# Patient Record
Sex: Female | Born: 1950 | Race: Black or African American | Hispanic: No | Marital: Single | Smoking: Never smoker
Health system: Southern US, Community
[De-identification: ages and names within clinical notes are randomized; demographics above are authoritative.]

## PROBLEM LIST (undated history)

## (undated) DIAGNOSIS — K219 Gastro-esophageal reflux disease without esophagitis: Secondary | ICD-10-CM

## (undated) DIAGNOSIS — E785 Hyperlipidemia, unspecified: Secondary | ICD-10-CM

## (undated) DIAGNOSIS — I10 Essential (primary) hypertension: Secondary | ICD-10-CM

## (undated) DIAGNOSIS — I2510 Atherosclerotic heart disease of native coronary artery without angina pectoris: Secondary | ICD-10-CM

## (undated) DIAGNOSIS — E119 Type 2 diabetes mellitus without complications: Secondary | ICD-10-CM

## (undated) DIAGNOSIS — E559 Vitamin D deficiency, unspecified: Secondary | ICD-10-CM

## (undated) DIAGNOSIS — H9041 Sensorineural hearing loss, unilateral, right ear, with unrestricted hearing on the contralateral side: Secondary | ICD-10-CM

## (undated) DIAGNOSIS — H3320 Serous retinal detachment, unspecified eye: Secondary | ICD-10-CM

## (undated) DIAGNOSIS — R011 Cardiac murmur, unspecified: Secondary | ICD-10-CM

## (undated) HISTORY — DX: Hyperlipidemia, unspecified: E78.5

## (undated) HISTORY — PX: BREAST BIOPSY: SHX20

## (undated) HISTORY — DX: Cardiac murmur, unspecified: R01.1

## (undated) HISTORY — DX: Atherosclerotic heart disease of native coronary artery without angina pectoris: I25.10

## (undated) HISTORY — DX: Vitamin D deficiency, unspecified: E55.9

## (undated) HISTORY — DX: Type 2 diabetes mellitus without complications: E11.9

## (undated) HISTORY — DX: Essential (primary) hypertension: I10

## (undated) HISTORY — DX: Sensorineural hearing loss, unilateral, right ear, with unrestricted hearing on the contralateral side: H90.41

## (undated) HISTORY — DX: Hypercalcemia: E83.52

## (undated) HISTORY — DX: Serous retinal detachment, unspecified eye: H33.20

## (undated) HISTORY — DX: Gastro-esophageal reflux disease without esophagitis: K21.9

---

## 1990-08-24 DIAGNOSIS — I219 Acute myocardial infarction, unspecified: Secondary | ICD-10-CM

## 1990-08-24 HISTORY — DX: Acute myocardial infarction, unspecified: I21.9

## 1998-12-10 ENCOUNTER — Emergency Department (HOSPITAL_COMMUNITY): Admission: EM | Admit: 1998-12-10 | Discharge: 1998-12-11 | Payer: Self-pay | Admitting: Emergency Medicine

## 1999-01-08 ENCOUNTER — Emergency Department (HOSPITAL_COMMUNITY): Admission: EM | Admit: 1999-01-08 | Discharge: 1999-01-08 | Payer: Self-pay

## 1999-02-06 ENCOUNTER — Emergency Department (HOSPITAL_COMMUNITY): Admission: EM | Admit: 1999-02-06 | Discharge: 1999-02-06 | Payer: Self-pay

## 1999-08-11 ENCOUNTER — Emergency Department (HOSPITAL_COMMUNITY): Admission: EM | Admit: 1999-08-11 | Discharge: 1999-08-11 | Payer: Self-pay | Admitting: *Deleted

## 2000-09-22 ENCOUNTER — Emergency Department (HOSPITAL_COMMUNITY): Admission: EM | Admit: 2000-09-22 | Discharge: 2000-09-23 | Payer: Self-pay | Admitting: Emergency Medicine

## 2001-03-28 ENCOUNTER — Emergency Department (HOSPITAL_COMMUNITY): Admission: EM | Admit: 2001-03-28 | Discharge: 2001-03-29 | Payer: Self-pay | Admitting: Emergency Medicine

## 2001-07-31 ENCOUNTER — Emergency Department (HOSPITAL_COMMUNITY): Admission: EM | Admit: 2001-07-31 | Discharge: 2001-08-01 | Payer: Self-pay | Admitting: Emergency Medicine

## 2002-08-05 ENCOUNTER — Emergency Department (HOSPITAL_COMMUNITY): Admission: EM | Admit: 2002-08-05 | Discharge: 2002-08-05 | Payer: Self-pay | Admitting: Emergency Medicine

## 2002-08-05 ENCOUNTER — Encounter: Payer: Self-pay | Admitting: Emergency Medicine

## 2002-12-08 ENCOUNTER — Other Ambulatory Visit: Admission: RE | Admit: 2002-12-08 | Discharge: 2002-12-08 | Payer: Self-pay | Admitting: Obstetrics and Gynecology

## 2003-08-23 ENCOUNTER — Emergency Department (HOSPITAL_COMMUNITY): Admission: AD | Admit: 2003-08-23 | Discharge: 2003-08-23 | Payer: Self-pay | Admitting: Family Medicine

## 2005-07-18 ENCOUNTER — Inpatient Hospital Stay (HOSPITAL_COMMUNITY): Admission: EM | Admit: 2005-07-18 | Discharge: 2005-07-23 | Payer: Self-pay | Admitting: Emergency Medicine

## 2006-08-30 ENCOUNTER — Encounter: Admission: RE | Admit: 2006-08-30 | Discharge: 2006-08-30 | Payer: Self-pay | Admitting: Occupational Medicine

## 2007-02-02 ENCOUNTER — Ambulatory Visit: Payer: Self-pay | Admitting: Internal Medicine

## 2007-02-02 ENCOUNTER — Encounter (INDEPENDENT_AMBULATORY_CARE_PROVIDER_SITE_OTHER): Payer: Self-pay | Admitting: Nurse Practitioner

## 2007-02-02 LAB — CONVERTED CEMR LAB: Hgb A1c MFr Bld: >14 %

## 2007-02-03 ENCOUNTER — Encounter (INDEPENDENT_AMBULATORY_CARE_PROVIDER_SITE_OTHER): Payer: Self-pay | Admitting: Nurse Practitioner

## 2007-02-03 LAB — CONVERTED CEMR LAB
AST: 14 units/L
Albumin: 4.7 g/dL
Alkaline Phosphatase: 98 units/L
Potassium: 5.1 meq/L
Sodium: 139 meq/L
Total Bilirubin: 0.5 mg/dL
Total CHOL/HDL Ratio: 6.1
Total Protein: 8 g/dL
Triglycerides: 169 mg/dL
VLDL: 34 mg/dL

## 2007-02-08 ENCOUNTER — Ambulatory Visit: Payer: Self-pay | Admitting: *Deleted

## 2007-02-28 ENCOUNTER — Ambulatory Visit: Payer: Self-pay | Admitting: Internal Medicine

## 2007-03-09 ENCOUNTER — Ambulatory Visit: Payer: Self-pay | Admitting: Internal Medicine

## 2007-03-11 ENCOUNTER — Ambulatory Visit: Payer: Self-pay | Admitting: Internal Medicine

## 2007-06-14 ENCOUNTER — Encounter (INDEPENDENT_AMBULATORY_CARE_PROVIDER_SITE_OTHER): Payer: Self-pay | Admitting: Nurse Practitioner

## 2007-06-14 DIAGNOSIS — Z8719 Personal history of other diseases of the digestive system: Secondary | ICD-10-CM | POA: Insufficient documentation

## 2007-06-14 DIAGNOSIS — I10 Essential (primary) hypertension: Secondary | ICD-10-CM | POA: Insufficient documentation

## 2007-06-14 DIAGNOSIS — E669 Obesity, unspecified: Secondary | ICD-10-CM

## 2007-07-25 ENCOUNTER — Emergency Department (HOSPITAL_COMMUNITY): Admission: EM | Admit: 2007-07-25 | Discharge: 2007-07-25 | Payer: Self-pay | Admitting: Family Medicine

## 2007-07-26 ENCOUNTER — Telehealth (INDEPENDENT_AMBULATORY_CARE_PROVIDER_SITE_OTHER): Payer: Self-pay | Admitting: Internal Medicine

## 2007-08-02 ENCOUNTER — Ambulatory Visit: Payer: Self-pay | Admitting: Family Medicine

## 2007-08-02 DIAGNOSIS — H5789 Other specified disorders of eye and adnexa: Secondary | ICD-10-CM

## 2007-08-02 DIAGNOSIS — L259 Unspecified contact dermatitis, unspecified cause: Secondary | ICD-10-CM

## 2007-08-02 LAB — CONVERTED CEMR LAB: Hgb A1c MFr Bld: 10.6 %

## 2007-08-10 ENCOUNTER — Encounter (INDEPENDENT_AMBULATORY_CARE_PROVIDER_SITE_OTHER): Payer: Self-pay | Admitting: *Deleted

## 2007-08-17 ENCOUNTER — Telehealth (INDEPENDENT_AMBULATORY_CARE_PROVIDER_SITE_OTHER): Payer: Self-pay | Admitting: Family Medicine

## 2007-10-13 ENCOUNTER — Telehealth (INDEPENDENT_AMBULATORY_CARE_PROVIDER_SITE_OTHER): Payer: Self-pay | Admitting: Internal Medicine

## 2009-02-08 ENCOUNTER — Observation Stay: Payer: Self-pay | Admitting: Internal Medicine

## 2009-02-11 ENCOUNTER — Emergency Department (HOSPITAL_COMMUNITY): Admission: EM | Admit: 2009-02-11 | Discharge: 2009-02-11 | Payer: Self-pay | Admitting: Emergency Medicine

## 2010-04-14 ENCOUNTER — Emergency Department (HOSPITAL_COMMUNITY): Admission: EM | Admit: 2010-04-14 | Discharge: 2010-04-14 | Payer: Self-pay | Admitting: Family Medicine

## 2010-05-14 ENCOUNTER — Ambulatory Visit: Payer: Self-pay | Admitting: Physician Assistant

## 2010-05-14 DIAGNOSIS — I2510 Atherosclerotic heart disease of native coronary artery without angina pectoris: Secondary | ICD-10-CM | POA: Insufficient documentation

## 2010-05-14 LAB — CONVERTED CEMR LAB
Bilirubin Urine: NEGATIVE
Blood Glucose, Fingerstick: 205
Blood in Urine, dipstick: NEGATIVE
Glucose, Urine, Semiquant: >=1000
Hgb A1c MFr Bld: 10.3 %
Ketones, urine, test strip: NEGATIVE
Nitrite: NEGATIVE
Protein, U semiquant: 30
Specific Gravity, Urine: >=1.03
Urobilinogen, UA: 0.2
WBC Urine, dipstick: NEGATIVE
pH: 5

## 2010-05-16 ENCOUNTER — Ambulatory Visit: Payer: Self-pay | Admitting: Physician Assistant

## 2010-06-06 ENCOUNTER — Encounter: Payer: Self-pay | Admitting: Physician Assistant

## 2010-06-24 ENCOUNTER — Telehealth (INDEPENDENT_AMBULATORY_CARE_PROVIDER_SITE_OTHER): Payer: Self-pay | Admitting: Internal Medicine

## 2010-06-25 ENCOUNTER — Telehealth (INDEPENDENT_AMBULATORY_CARE_PROVIDER_SITE_OTHER): Payer: Self-pay | Admitting: Internal Medicine

## 2010-06-25 ENCOUNTER — Ambulatory Visit: Payer: Self-pay | Admitting: Physician Assistant

## 2010-07-02 ENCOUNTER — Encounter (INDEPENDENT_AMBULATORY_CARE_PROVIDER_SITE_OTHER): Payer: Self-pay | Admitting: Internal Medicine

## 2010-07-14 ENCOUNTER — Ambulatory Visit: Payer: Self-pay | Admitting: Nurse Practitioner

## 2010-07-14 DIAGNOSIS — E119 Type 2 diabetes mellitus without complications: Secondary | ICD-10-CM

## 2010-07-14 LAB — CONVERTED CEMR LAB
Bilirubin Urine: NEGATIVE
Hgb A1c MFr Bld: 8.7 %
Ketones, urine, test strip: NEGATIVE
Nitrite: NEGATIVE
Urobilinogen, UA: 0.2

## 2010-07-15 LAB — CONVERTED CEMR LAB: Microalb, Ur: 5.16 mg/dL — ABNORMAL HIGH (ref 0.00–1.89)

## 2010-07-22 ENCOUNTER — Encounter: Admission: RE | Admit: 2010-07-22 | Discharge: 2010-07-22 | Payer: Self-pay | Admitting: Internal Medicine

## 2010-07-29 ENCOUNTER — Encounter: Admission: RE | Admit: 2010-07-29 | Discharge: 2010-07-29 | Payer: Self-pay | Admitting: Internal Medicine

## 2010-08-05 HISTORY — PX: BREAST BIOPSY: SHX20

## 2010-08-20 ENCOUNTER — Encounter (INDEPENDENT_AMBULATORY_CARE_PROVIDER_SITE_OTHER): Payer: Self-pay | Admitting: Nurse Practitioner

## 2010-09-25 NOTE — Assessment & Plan Note (Signed)
Summary: Breast tenderness/Diabetes   Vital Signs:  Patient profile:   60 year old female Menstrual status:  hysterectomy Weight:      221.5 pounds BMI:     39.38 Temp:     97.8 degrees F oral Pulse rate:   88 / minute Pulse rhythm:   regular Resp:     20 per minute BP sitting:   147 / 70  (left arm) Cuff size:   regular  Vitals Entered By: Levon Hedger (July 14, 2010 9:54 AM)  Nutrition Counseling: Patient's BMI is greater than 25 and therefore counseled on weight management options. CC: right breast pain with swelling under her arm x 1 week, feels heavy and very strange, Hypertension Management Is Patient Diabetic? Yes Pain Assessment Patient in pain? yes     Location: breast CBG Result 321 CBG Device ID B  Does patient need assistance? Functional Status Self care Ambulation Normal     Menstrual Status hysterectomy   Primary Care Provider:  Tereso Newcomer, PA-C  CC:  right breast pain with swelling under her arm x 1 week, feels heavy and very strange, and Hypertension Management.  History of Present Illness:  Pt into the office with complaints of pain in breast Started 2 week ago with problems in right breast +tender +burning No previous problems with breast Admits that she used to drink lots of caffiene so she stopped drinking coffee and symptoms did not resolve Hx of fibrocystic breast   S/p hysterecomy about 25 yrs ago  Pt has to pick up her medications from the pharmacy today.  Diabetes Management History:      The patient is a 60 years old female who comes in for evaluation of DM Type 2.  She has not been enrolled in the "Diabetic Education Program".  She states understanding of dietary principles but she is not following the appropriate diet.  Self foot exams are not being performed.  She says that she is not exercising regularly.        Hypoglycemic symptoms are not occurring.  No hyperglycemic symptoms are reported.  Other comments include: pt  needs to pick up her meds from the pharmacy.    Hypertension History:      She denies headache, chest pain, and palpitations.  She notes no problems with any antihypertensive medication side effects.        Positive major cardiovascular risk factors include female age 25 years old or older, diabetes, hypertension, and family history for ischemic heart disease (males less than 51 years old).  Negative major cardiovascular risk factors include non-tobacco-user status.        Positive history for target organ damage include ASHD (either angina/prior MI/prior CABG).     Habits & Providers  Alcohol-Tobacco-Diet     Alcohol type: beer     Tobacco Status: never     Passive Smoke Exposure: no  Exercise-Depression-Behavior     Does Patient Exercise: no     Drug Use: no     Seat Belt Use: 100     Sun Exposure: occasionally  Allergies (verified): 1)  ! * Dye  Review of Systems General:  + right breast tenderness. CV:  Denies chest pain or discomfort. Resp:  Denies cough. GI:  Denies abdominal pain, nausea, and vomiting.  Physical Exam  General:  alert.   Head:  normocephalic.   Eyes:  glasses Breasts:  right breast - tender with palpation of breast tissue (especially at 6 o'clock) left breast - nontender  Lungs:  normal breath sounds.   Heart:  normal rate and regular rhythm.   Abdomen:  normal bowel sounds.   Msk:  up to the exam table Neurologic:  alert & oriented X3.   Skin:  color normal.   Psych:  Oriented X3.    Diabetes Management Exam:    Foot Exam (with socks and/or shoes not present):       Sensory-Monofilament:          Left foot: normal          Right foot: normal   Impression & Recommendations:  Problem # 1:  UNSPECIFIED BREAST SCREENING (ICD-V76.10)  ? infection in her right breast pt advised to decrease the caffiene will order mammogram  self breast exam placcard given  Orders: Mammogram (Diagnostic) (Mammo)  Problem # 2:  HYPERTENSION, BENIGN  ESSENTIAL (ICD-401.1) BP elevated but pt needs to get her medications Her updated medication list for this problem includes:    Lisinopril 5 Mg Tabs (Lisinopril) .Marland Kitchen... 1 tab by mouth daily  Problem # 3:  DIABETES MELLITUS, TYPE II (ICD-250.00)  advised pt that she needs to take insulin as ordered may need additional titration of meds Hgba1c = 8.1 today Her updated medication list for this problem includes:    Lantus Solostar 100 Unit/ml Soln (Insulin glargine) ..... Inject 60 units at bedtime    Lisinopril 5 Mg Tabs (Lisinopril) .Marland Kitchen... 1 tab by mouth daily    Novolog Flexpen 100 Unit/ml Soln (Insulin aspart) .Marland KitchenMarland KitchenMarland KitchenMarland Kitchen 30 units subcutaneously three times a day 15 minutes before meals    Aspirin 81 Mg Tabs (Aspirin) ..... Once daily  Orders: Capillary Blood Glucose/CBG (04540) UA Dipstick w/o Micro (manual) (98119) T-Urine Microalbumin w/creat. ratio [phone])  Problem # 4:  OBESITY (ICD-278.00) advise pt that she need to take her medications daily as ordered she will go pick up insulin from pharmacy  Complete Medication List: 1)  Lantus Solostar 100 Unit/ml Soln (Insulin glargine) .... Inject 60 units at bedtime 2)  Pepcid 20 Mg Tabs (Famotidine) .... Take 1 tablet by mouth two times a day as needed 3)  Lisinopril 5 Mg Tabs (Lisinopril) .Marland Kitchen.. 1 tab by mouth daily 4)  Novolog Flexpen 100 Unit/ml Soln (Insulin aspart) .... 30 units subcutaneously three times a day 15 minutes before meals 5)  Aspirin 81 Mg Tabs (Aspirin) .... Once daily 6)  Ciprofloxacin Hcl 500 Mg Tabs (Ciprofloxacin hcl) .... One tablet by mouth two times a day for infection 7)  Ibuprofen 800 Mg Tabs (Ibuprofen) .... One tablet by mouth two times a day as needed for pain  Other Orders: Hemoglobin A1C (83036) Flu Vaccine 86yrs + (57846) Admin 1st Vaccine (96295)  Diabetes Management Assessment/Plan:      Her blood pressure goal is < 130/80.    Hypertension Assessment/Plan:      The patient's hypertensive  risk group is category C: Target organ damage and/or diabetes.  Today's blood pressure is 147/70.  Her blood pressure goal is < 130/80.  Patient Instructions: 1)  Schedule a lab visit on for fasting labs - No food after midnight before this visit - lipids, cmet, cbc, tsh, rapid hiv 2)  Schedule f/u appointment for diabetes in 2 months. will need titration of medications 3)  Will need u/a, cbg, foot check. 4)  Keep your appointment for mammogram 5)  Decrease caffiene in your diet 6)  Take cipro 500mg  by mouth two times a day for 7 days. 7)  Do not take this  within 2 hours of dairy products. 8)  Also take ibuprofen 800mg  by mouth two times a day (with food) for 3 days then as needed for tenderness Prescriptions: IBUPROFEN 800 MG TABS (IBUPROFEN) One tablet by mouth two times a day as needed for pain  #50 x 0   Entered and Authorized by:   Lehman Prom FNP   Signed by:   Lehman Prom FNP on 07/14/2010   Method used:   Print then Give to Patient   RxID:   [id number] CIPROFLOXACIN HCL 500 MG TABS (CIPROFLOXACIN HCL) One tablet by mouth two times a day for infection  #14 x 0   Entered and Authorized by:   Lehman Prom FNP   Signed by:   Lehman Prom FNP on 07/14/2010   Method used:   Print then Give to Patient   RxID:   [id number]    Orders Added: 1)  Capillary Blood Glucose/CBG [82948] 2)  Est. Patient Level III [16010] 3)  Hemoglobin A1C [83036] 4)  UA Dipstick w/o Micro (manual) [81002] 5)  T-Urine Microalbumin w/creat. ratio [82043-[address]] 6)  Mammogram (Diagnostic) [Mammo] 7)  Flu Vaccine 22yrs + [90658] 8)  Admin 1st Vaccine [93235]   Immunizations Administered:  Influenza Vaccine # 1:    Vaccine Type: Fluvax 3+    Site: left deltoid    Mfr: GlaxoSmithKline    Dose: 0.5 ml    Route: IM    Given by: Levon Hedger    Exp. Date: 02/21/2011    Lot #: [id number]    VIS given: 03/18/10 version given July 14, 2010.  Flu Vaccine Consent  Questions:    Do you have a history of severe allergic reactions to this vaccine? no    Any prior history of allergic reactions to egg and/or gelatin? no    Do you have a sensitivity to the preservative Thimersol? no    Do you have a past history of Guillan-Barre Syndrome? no    Do you currently have an acute febrile illness? no    Have you ever had a severe reaction to latex? no    Vaccine information given and explained to patient? yes    Are you currently pregnant? no    ndc  [phone]  Immunizations Administered:  Influenza Vaccine # 1:    Vaccine Type: Fluvax 3+    Site: left deltoid    Mfr: GlaxoSmithKline    Dose: 0.5 ml    Route: IM    Given by: Levon Hedger    Exp. Date: 02/21/2011    Lot #: [id number]    VIS given: 03/18/10 version given July 14, 2010.  Laboratory Results   Urine Tests  Date/Time Received: July 14, 2010 10:59 AM   Routine Urinalysis   Color: lt. yellow Appearance: Clear Glucose: >=1000   (Normal Range: Negative) Bilirubin: negative   (Normal Range: Negative) Ketone: negative   (Normal Range: Negative) Spec. Gravity: 1.015   (Normal Range: 1.003-1.035) Blood: negative   (Normal Range: Negative) pH: 5.0   (Normal Range: 5.0-8.0) Protein: trace   (Normal Range: Negative) Urobilinogen: 0.2   (Normal Range: 0-1) Nitrite: negative   (Normal Range: Negative) Leukocyte Esterace: negative   (Normal Range: Negative)     Blood Tests   Date/Time Received: July 14, 2010 11:28 AM   HGBA1C: 8.7%   (Normal Range: Non-Diabetic - 3-6%   Control Diabetic - 6-8%) CBG Random:: 321       Last LDL:  250 (02/03/2007 4:03:47 PM)          Diabetic Foot Exam    10-g (5.07) Semmes-Weinstein Monofilament Test Performed by: Levon Hedger          Right Foot          Left Foot Visual Inspection               Test Control      normal         normal Site 1         normal          normal Site 2         normal         normal Site 3         normal         normal Site 4         normal         normal Site 5         normal         normal Site 6         normal         normal Site 7         normal         normal Site 8         normal         normal Site 9                    abnormal Site 10         normal         normal  Impression      normal         normal     Laboratory Results   Urine Tests    Routine Urinalysis   Color: lt. yellow Appearance: Clear Glucose: >=1000   (Normal Range: Negative) Bilirubin: negative   (Normal Range: Negative) Ketone: negative   (Normal Range: Negative) Spec. Gravity: 1.015   (Normal Range: 1.003-1.035) Blood: negative   (Normal Range: Negative) pH: 5.0   (Normal Range: 5.0-8.0) Protein: trace   (Normal Range: Negative) Urobilinogen: 0.2   (Normal Range: 0-1) Nitrite: negative   (Normal Range: Negative) Leukocyte Esterace: negative   (Normal Range: Negative)     Blood Tests     HGBA1C: 8.7%   (Normal Range: Non-Diabetic - 3-6%   Control Diabetic - 6-8%) CBG Random:: 321mg /dL

## 2010-09-25 NOTE — Letter (Signed)
Summary: Generic Letter  [facility]  [address] [place], [place] [address]   Phone: [phone]  Fax: [phone]        07/02/2010  Diamond Eckerson [address] [place], [place]  [address]  Dear Ms. SCROGGIN,  We have been unable to contact you by telephone.  Please call our office, at your earliest convenience, so that we may speak with you.   Sincerely,   Dutch Quint RN

## 2010-09-25 NOTE — Progress Notes (Signed)
Summary: Needs Hep B Records  Phone Note Outgoing Call   Summary of Call: Pt. needs records of having received Hep B vaccine.  Wants this office to call [facility]  [phone]  to get records of vaccine, then fax it to school.  She will call back with information for school fax.  States that she had vaccine about 1-2 years ago.  Called [facility] to request information.  Left on hold, no answer. Initial call taken by: Dutch Quint RN,  June 25, 2010 1:41 PM  Follow-up for Phone Call        Call placed to [facility] - spoke with Elease Hashimoto.  States will fax Hep B records to this office.  Dutch Quint RN  June 26, 2010 9:25 AM  Received fax from [facility].  There are two pages of consents, one signed giving consent and one unsigned refusing the vaccine.  No other information is given.  Left message on answering machine for pt. to return call.  Dutch Quint RN  June 26, 2010 4:34 PM   Additional Follow-up for Phone Call Additional follow up Details #1::        Did they give the vaccine--is that in the records? If not, when pt. calls back, need to inform her that she just needs to get the vaccine as it does not appear she ever received and in fact, may have refused vaccination. I would like to see the paperwork, if you would place it in my workstation chair in the clinical area (back) Additional Follow-up by: Julieanne [place] MD,  June 27, 2010 11:33 AM    Additional Follow-up for Phone Call Additional follow up Details #2::    There is nothing in the records that they gave her the vaccine.  On your chair.  Dutch Quint RN  June 27, 2010 3:37 PM  Reviewed by Dr. Delrae Alfred.  Left message on voicemail for pt. to return call.  Dutch Quint RN  June 27, 2010 3:40 PM  Left message on voicemail for pt. to return call.  Dutch Quint RN  June 30, 2010 3:06 PM  Mailbox full.  Letter sent.  Dutch Quint RN  July 02, 2010 3:06 PM

## 2010-09-25 NOTE — Letter (Signed)
Summary: FAXED REQUESTED RECORDS TO [facility]  FAXED REQUESTED RECORDS TO [facility]   Imported By: Arta Bruce 06/09/2010 12:02:24  _____________________________________________________________________  External Attachment:    Type:   Image     Comment:   External Document

## 2010-09-25 NOTE — Progress Notes (Signed)
Summary: MEDS REFILL  Phone Note Refill Request Call back at [phone]   Refills Requested: Medication #1:  NOVOLOG FLEXPEN 100 UNIT/ML SOLN per sliding scale Initial call taken by: Domenic Polite,  June 24, 2010 11:45 AM  Follow-up for Phone Call        Rx refill and note sent to [facility].  Dutch Quint RN  June 26, 2010 9:20 AM     New/Updated Medications: NOVOLOG FLEXPEN 100 UNIT/ML SOLN (INSULIN ASPART) 30 units Subcutaneously three times a day 15 minutes before meals Prescriptions: NOVOLOG FLEXPEN 100 UNIT/ML SOLN (INSULIN ASPART) 30 units Subcutaneously three times a day 15 minutes before meals  #1 month x 11   Entered by:   Armenia Shannon   Authorized by:   Julieanne [place] MD   Signed by:   Armenia Shannon on 06/25/2010   Method used:   Faxed to ...       [facility] (retail)       [address] [place], [place]  [address]       Ph: [phone]       Fax: [phone]   RxID:   [id number]

## 2010-09-25 NOTE — Letter (Signed)
Summary: EMPLOYEE HEPATITIS VACCINATION DECLINATION   EMPLOYEE HEPATITIS VACCINATION DECLINATION   Imported By: Arta Bruce 07/02/2010 15:18:37  _____________________________________________________________________  External Attachment:    Type:   Image     Comment:   External Document

## 2010-09-25 NOTE — Assessment & Plan Note (Signed)
Summary: Re-estab; Diabetes   Vital Signs:  Charlotte Stuart profile:   60 year old female Height:      63 inches Weight:      221 pounds BMI:     39.29 Temp:     98.4 degrees F oral Pulse rate:   80 / minute Pulse rhythm:   regular Resp:     18 per minute BP sitting:   151 / 80  (left arm)  Vitals Entered By: CMA Student Linzie Collin CC: office visit for DM, Charlotte Stuart is out of medication for diabetes, Charlotte Stuart wants a tetnas and hep. B shot Is Charlotte Stuart Diabetic? Yes Pain Assessment Charlotte Stuart in pain? no      CBG Result 205  Does Charlotte Stuart need assistance? Functional Status Self care Ambulation Normal   Primary Care Provider:  Tereso Newcomer, PA-C  CC:  office visit for DM, Charlotte Stuart is out of medication for diabetes, and Charlotte Stuart wants a tetnas and hep. B shot.  History of Present Illness: Here to re-estab. Was seeing Dr. Barbaraann Barthel in 2008. Went to [employer] in [place] until recently.  Was laid off and lost insurance. Ran out of insulin.  Got 70/30 and took for a while.  Out of everything now. Feels lightheaded and nauseated when her sugars are up. Was on Lantus 60 and SSI Novolog and states sugars were good. +HAs; + blurred vision + polyuria; + polydipsia; + polyphagia No chest pain or syncope. Notes increasing DOE when her sugar is up.  Usually happens when her sugars are out of control.  Nothing really out of the ordinary.  Habits & Providers  Alcohol-Tobacco-Diet     Tobacco Status: never  Exercise-Depression-Behavior     Drug Use: no  Problems Prior to Update: 1)  Preventive Health Care  (ICD-V70.0) 2)  Coronary Atherosclerosis Native Coronary Artery  (ICD-414.01) 3)  Contact Dermatitis  (ICD-692.9) 4)  Redness or Discharge of Eye  (ICD-379.93) 5)  Hypertension, Benign Essential  (ICD-401.1) 6)  Obesity  (ICD-278.00) 7)  Gastroesophageal Reflux Disease, Hx of  (ICD-V12.79) 8)  Diabetes Mellitus, Type II, Hx of  (ICD-V12.2)  Current Medications (verified): 1)  Lantus  100 Unit/ml  Soln (Insulin Glargine) .... 45 Units Subcutaneously At Night For Diabetes 2)  Protonix 40 Mg  Tbec (Pantoprazole Sodium) .Marland Kitchen.. 1 Tablet By Mouth Daily For Stomach 3)  Triamcinolone Acetonide 0.1 % Crea (Triamcinolone Acetonide) .... Apply To Rash Two Times A Day and Rub in Well 4)  Lisinopril 5 Mg  Tabs (Lisinopril) .Marland Kitchen.. 1 Tab By Mouth Daily 5)  Diabetic Shoes  Allergies (verified): 1)  ! * Dye  Past History:  Past Medical History: Current Problems:  HYPERTENSION, BENIGN ESSENTIAL (ICD-401.1) OBESITY (ICD-278.00) GASTROESOPHAGEAL REFLUX DISEASE, HX OF (ICD-V12.79) DIABETES MELLITUS, TYPE II, HX OF (ICD-V12.2) ? h/o MI   a.  1990s; [facility] in [place]; had cath; no stents; never followed up; was supposed to take ASA  Past Surgical History: Hysterectomy   a. TAH/BSO  Family History: colon CA - bro age 53 no breast CA CAD - father had MI  Social History: Never Smoked Alcohol use-yes (occ) Drug use-no Divorced 2 kids  Review of Systems      See HPI  Physical Exam  General:  alert, well-developed, and well-nourished.   Head:  normocephalic and atraumatic.   Eyes:  fundi diff to visualize Neck:  supple.   Lungs:  normal breath sounds.   Heart:  normal rate and regular rhythm.   Abdomen:  soft  and no hepatomegaly.   Extremities:  trace left pedal edema and trace right pedal edema.   Neurologic:  alert & oriented X3 and cranial nerves II-XII intact.   Psych:  normally interactive.     Impression & Recommendations:  Problem # 1:  DIABETES MELLITUS, TYPE II, HX OF (ICD-V12.2) has protein in urine get back on ACE restart meds f/u in 3 mos refer to [facility] for diet  Orders: Capillary Blood Glucose/CBG (04540) Hemoglobin A1C (98119) T-Urinalysis (14782-95621) T-Comprehensive Metabolic Panel (30865-78469) T-Lipid Profile (62952-84132)  Problem # 2:  HYPERTENSION, BENIGN ESSENTIAL (ICD-401.1) restart meds  Her updated medication list for  this problem includes:    Lisinopril 5 Mg Tabs (Lisinopril) .Marland Kitchen... 1 tab by mouth daily  Orders: T-Urinalysis (44010-27253) T-Comprehensive Metabolic Panel [phone]) T-Lipid Profile (59563-87564)  Problem # 3:  CORONARY ATHEROSCLEROSIS NATIVE CORONARY ARTERY (ICD-414.01) ? diagnosis will attempt to get records get her back on ASA for now  Her updated medication list for this problem includes:    Lisinopril 5 Mg Tabs (Lisinopril) .Marland Kitchen... 1 tab by mouth daily    Aspirin 81 Mg Tabs (Aspirin) ..... Once daily  Problem # 4:  GASTROESOPHAGEAL REFLUX DISEASE, HX OF (ICD-V12.79) can continue on pepcid has taken over the counter meds with relief  Problem # 5:  Preventive Health Care (ICD-V70.0)  needs CPP. . . will eventually get this done will need referral for colo with FHx of cancer discuss setting up CPP at f/u OV needs documentation of Hep immunity . . . had 3 shot series check labs  Orders: T-Hepatitis B Core Antibody ([id number]) T-Hepatitis B Surface Antibody (33295-18841) T-Hepatitis B Surface Antigen (66063-01601)  Complete Medication List: 1)  Lantus Solostar 100 Unit/ml Soln (Insulin glargine) .... Inject 60 units at bedtime 2)  Pepcid 20 Mg Tabs (Famotidine) .... Take 1 tablet by mouth two times a day as needed 3)  Lisinopril 5 Mg Tabs (Lisinopril) .Marland Kitchen.. 1 tab by mouth daily 4)  Novolog Flexpen 100 Unit/ml Soln (Insulin aspart) .... Per sliding scale 5)  Aspirin 81 Mg Tabs (Aspirin) .... Once daily  Charlotte Stuart Instructions: 1)  Sign release form to get records from [facility] in [phone] for heart attack.  Need discharge summary and cath report. 2)  Take an Aspirin every day.  3)  Please schedule a follow-up appointment in 3 months for diabetes. 4)  If you want to change to the [facility] office, let us know.  5)  I have faxed your medicine to the [facility]. pharmacy.  We will see if you can get them today. 6)  Place a PPD today. Prescriptions: LISINOPRIL  5 MG  TABS (LISINOPRIL) 1 tab by mouth daily  #30 x 5   Entered and Authorized by:   Tereso Newcomer PA-C   Signed by:   Tereso Newcomer PA-C on 05/14/2010   Method used:   Faxed to ...       [facility]ail)       [address] [place], [place]  [address]       Ph: [phone]       Fax: [phone]   RxID:   [id number] PEPCID 20 MG TABS (FAMOTIDINE) Take 1 tablet by mouth two times a day as needed  #60 x 3   Entered and Authorized by:   Tereso Newcomer PA-C   Signed by:   Tereso Newcomer PA-C on 05/14/2010   Method used:  Faxed to ...       [facility] (retail)       [address] [place], [place]  [address]       Ph: [phone]       Fax: [phone]   RxID:   [id number] LANTUS SOLOSTAR 100 UNIT/ML SOLN (INSULIN GLARGINE) inject 60 units at bedtime  #1 mo supply x 11   Entered and Authorized by:   Tereso Newcomer PA-C   Signed by:   Tereso Newcomer PA-C on 05/14/2010   Method used:   Faxed to ...       [facility] (retail)       [address] [place], [place]  [address]       Ph: [phone]       Fax: [phone]   RxID:   [id number] NOVOLOG FLEXPEN 100 UNIT/ML SOLN (INSULIN ASPART) per sliding scale  #1 mo supply x 11   Entered and Authorized by:   Tereso Newcomer PA-C   Signed by:   Tereso Newcomer PA-C on 05/14/2010   Method used:   Faxed to ...       [facility] (retail)       [address] [place], [place]  [address]       Ph: [phone]       Fax: [phone]   RxID:   [id number]   Laboratory Results   Urine Tests  Date/Time Received: May 14, 2010 12:10 PM   Routine Urinalysis   Glucose: >=1000   (Normal Range: Negative) Bilirubin: negative   (Normal Range: Negative) Ketone: negative   (Normal Range: Negative) Spec. Gravity: >=1.030   (Normal Range:  1.003-1.035) Blood: negative   (Normal Range: Negative) pH: 5.0   (Normal Range: 5.0-8.0) Protein: 30   (Normal Range: Negative) Urobilinogen: 0.2   (Normal Range: 0-1) Nitrite: negative   (Normal Range: Negative) Leukocyte Esterace: negative   (Normal Range: Negative)     Blood Tests   Date/Time Received: May 14, 2010 12:10 PM   HGBA1C: 10.3%   (Normal Range: Non-Diabetic - 3-6%   Control Diabetic - 6-8%) CBG Random:: 205mg /dL     Appended Document: Re-estab; Diabetes   PPD Application    Vaccine Type: PPD    Site: left forearm    Mfr: jhp    Dose: 0.1 ml    Route: ID    Given by: Armenia shannon    Exp. Date: 08/2011    Lot #: [id number]

## 2010-09-25 NOTE — Letter (Signed)
Summary: TEST ORDER FORM/MAMMOGRAM  TEST ORDER FORM/MAMMOGRAM   Imported By: Arta Bruce 07/15/2010 15:51:41  _____________________________________________________________________  External Attachment:    Type:   Image     Comment:   External Document

## 2010-11-07 LAB — DIFFERENTIAL
Basophils Relative: 0 % (ref 0–1)
Lymphocytes Relative: 36 % (ref 12–46)
Lymphs Abs: 2.6 10*3/uL (ref 0.7–4.0)
Monocytes Absolute: 0.4 10*3/uL (ref 0.1–1.0)
Monocytes Relative: 6 % (ref 3–12)
Neutro Abs: 4.2 10*3/uL (ref 1.7–7.7)
Neutrophils Relative %: 57 % (ref 43–77)

## 2010-11-07 LAB — HEMOGLOBIN A1C: Mean Plasma Glucose: 235 mg/dL — ABNORMAL HIGH (ref <117)

## 2010-11-07 LAB — POCT URINALYSIS DIPSTICK
Bilirubin Urine: NEGATIVE
Glucose, UA: 500 mg/dL — AB
Hgb urine dipstick: NEGATIVE
Ketones, ur: 40 mg/dL — AB
Nitrite: NEGATIVE

## 2010-11-07 LAB — POCT I-STAT, CHEM 8
BUN: 18 mg/dL (ref 6–23)
Chloride: 105 mEq/L (ref 96–112)
Creatinine, Ser: 0.8 mg/dL (ref 0.4–1.2)
Potassium: 4.9 mEq/L (ref 3.5–5.1)
Sodium: 136 mEq/L (ref 135–145)
TCO2: 23 mmol/L (ref 0–100)

## 2010-11-07 LAB — CBC
HCT: 39.6 % (ref 36.0–46.0)
Hemoglobin: 13.3 g/dL (ref 12.0–15.0)
RBC: 4.74 MIL/uL (ref 3.87–5.11)
WBC: 7.3 10*3/uL (ref 4.0–10.5)

## 2010-12-01 LAB — DIFFERENTIAL
Eosinophils Absolute: 0.1 10*3/uL (ref 0.0–0.7)
Eosinophils Relative: 1 % (ref 0–5)
Lymphs Abs: 2.4 10*3/uL (ref 0.7–4.0)
Monocytes Absolute: 0.4 10*3/uL (ref 0.1–1.0)
Monocytes Relative: 5 % (ref 3–12)

## 2010-12-01 LAB — URINALYSIS, ROUTINE W REFLEX MICROSCOPIC
Hgb urine dipstick: NEGATIVE
Nitrite: NEGATIVE
Specific Gravity, Urine: 1.034 — ABNORMAL HIGH (ref 1.005–1.030)
Urobilinogen, UA: 0.2 mg/dL (ref 0.0–1.0)
pH: 5 (ref 5.0–8.0)

## 2010-12-01 LAB — URINE MICROSCOPIC-ADD ON

## 2010-12-01 LAB — CBC
MCHC: 33.8 g/dL (ref 30.0–36.0)
RBC: 4.34 MIL/uL (ref 3.87–5.11)
RDW: 13.9 % (ref 11.5–15.5)

## 2010-12-01 LAB — COMPREHENSIVE METABOLIC PANEL
ALT: 22 U/L (ref 0–35)
AST: 19 U/L (ref 0–37)
Calcium: 9.5 mg/dL (ref 8.4–10.5)
GFR calc Af Amer: >60 mL/min (ref >60)
Sodium: 144 mEq/L (ref 135–145)
Total Protein: 7.3 g/dL (ref 6.0–8.3)

## 2011-01-09 NOTE — Consult Note (Signed)
NAMETENNELLE, TAFLINGER               ACCOUNT NO.:  [id number]   MEDICAL RECORD NO.:  [id number]          PATIENT TYPE:  INP   LOCATION:  5523                         FACILITY:  [facility]   PHYSICIAN:  Kristine Garbe. Ezzard Standing, M.D.DATE OF BIRTH:  [birth year]   DATE OF CONSULTATION:  07/20/2005  DATE OF DISCHARGE:                                   CONSULTATION   REASON FOR CONSULTATION:  Evaluate patient with left parotitis, left ear  complaint.   BRIEF HISTORY:  Charlotte Stuart is a 60 year old female with a history of  diabetes.  Apparently, was not taking her medication at home and had some  abdominal distress with dehydration and was admitted to the hospital.  She  was noted to have swelling of the left parotid area and complained of left  ear discomfort, as well as presently complaining of some throat discomfort  status post removal of a nasogastric tube.  Her white count on admission was  7,800.  Her most recent white count was 5,600.  Her blood sugars were  elevated at time of admission.  Presently, under reasonably good control.   PHYSICAL EXAMINATION:  HEENT:  The patient has a swelling of the left  parotid region.  On palpation, this fairly soft, but is definitely swollen  compared to the right side.  It is slightly tender.  No real induration.  Drainage from the parotid duct actually is reasonably clear.  Oral exam is  otherwise unremarkable.  Ear exam revealed a normal ear canal.  Left ear canal particularly is clear.  TM is normal.  NECK:  No significant swelling.  No significant adenopathy.   IMPRESSION:  Resolving parotitis.  Questionable whether this is bacterial  infection or not.  Would recommend treatment of this with hydration, either  p.o., or IV, along with lemon drops to promote salivation, and if she has  any fever or persistent pain, would treat with antibiotic, Keflex 500 mg  t.i.d.--5 days would probably be adequate, versus IV Ancef if she is unable  to take p.o.   Would expect this to resolve without any problems.  If she has  any persistent problems, we will follow up as an outpatient in my office or  in the hospital if needed.  Office number [phone].           ______________________________  Kristine Garbe. Ezzard Standing, M.D.     CEN/MEDQ  D:  07/20/2005  T:  07/21/2005  Job:  [id number]

## 2011-01-09 NOTE — H&P (Signed)
NAMEADRINNE, Charlotte Stuart               ACCOUNT NO.:  [id number]   MEDICAL RECORD NO.:  [id number]          PATIENT TYPE:  INP   LOCATION:  1826                         FACILITY:  [facility]   PHYSICIAN:  Melissa L. Ladona Ridgel, MD  DATE OF BIRTH:  [birth year]   DATE OF ADMISSION:  07/18/2005  DATE OF DISCHARGE:                                HISTORY & PHYSICAL   CHIEF COMPLAINT:  Chest pain.   PRIMARY CARE PHYSICIAN:  The patient's primary care physician is unassigned;  however, she has  seen Dr. Renaye Rakers.   HISTORY OF THE PRESENT ILLNESS:  The patient is a 60 year old African-  American female who has been out of work and could not get her medications.  She states she used to go to Dr. Parke Simmers, but when she could not get her  medications she quit going.  The patient presented to the emergency room  complaining of gas pain, 10/10, coming and going.  The patient states that  she took some Mylanta with some relief; however, she continues to have chest  discomfort and excessive burping.   REVIEW OF SYSTEMS:  No fevers.  No chills. No nausea.  Positive vomiting.  No joint issues and no sick contacts,  all other review of systems are  negative.   PAST MEDICAL HISTORY:  1.  Diabetes.  2.  Constipation; last bowel movement was one week ago  .   PAST SURGICAL HISTORY:  Hysterectomy.   SOCIAL HISTORY:  The patient denies tobacco use. She has an occasional drink  on the weekends.  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   FAMILY HISTORY:  Mother is living with no problems.  Father is deceased  secondary to a myocardial infarction in his 13s.   ALLERGIES:  Drug allergies none.   MEDICATIONS:  Glucophage, Glucotrol and Actos; dosages of which are unknown.  The patient has been out of her medications for quite some time.   PHYSICAL EXAMINATION:  VITAL SIGNS:  Temperature is 97.4, blood pressure  137/63, pulse 117, respirations 22, and saturation 98%.  GENERAL APPEARANCE:  This is a  mildly distress African-American female who  is  having some discomfort related to abdominal distention and belching.  HEENT:  The patient is normocephalic and atraumatic.  Pupils are equal,  round and react to light.  Extraocular movements are intact.  Mucous  membranes are moist.  NECK:  The patient has no carotid bruits and no thyromegaly.  CHEST:  The chest has decreased breath sounds bilaterally with no rhonchi,  rales or wheezes.  HEART:  Cardiovascular shows regular rate and rhythm.  Positive S1 and S2.  No S3 or S4.  no murmurs, rubs or gallops.  ABDOMEN:  The abdomen is nontender with mild-to-moderate distention and very  slow bowel sounds.  EXTREMITIES:  The extremities show no clubbing, cyanosis or edema, or  lesions.  NEUROLOGIC EXAMINATION:  Neurologically she is very histrionic.  Power is  5/5.  DTRs are 2+.  Respiratory rate is increased when talking to her and  relaxes when she speaks.   LABORATORY  DATA:  Pertinent laboratory values:  White count 7.8, hemoglobin  17.8, hematocrit 38.4 and platelets 367,000.  Sodium is 128, CO2 is 20, BUN  is 38, creatinine is 1.8, and glucose 562.  Her gap is 25.  She has no chest  x-ray;  that will be ordered.  EKG shows 96 bears/minute with no ST-T wave  changes.   ASSESSMENT AND PLAN:  This is a 60 year old African-American female with  known diabetes who presents with abdominal discomfort.  She was found to be  in diabetic ketoacidosis.  She is currently feeling betters, but has not  been able to sleep and has continued to have some sensation of needing to  belch secondary to a distended abdomen.   Currently, I agree with starting glucommander with aggressive rehydration.   1.  Cardiovascular:  We will check a repeat electrocardiogram, some cardiac      markers and chest x-ray.  We will resume simethicone.   1.  Pulmonary:  The patient has no chest pain.  We will her in  few minute      regarding an order.   1.   Gastrointestinal:  The patient is nothing per os for now.  We will      treated her with a proton pump inhibitor and simethicone.  We will check      an acute abdominal series, which should include a chest x-ray.   1.  Genitourinary:  We will check a urinalysis, and culture and sensitivity,      and perform strict Is and Os on the patient.   1.  Endocrine:  Please see the glucommander orders and order a fasting lipid      panel for the morning.   1.  Deep venous thrombosis prophylaxis:  We will check a D-dimer and if      positive we will consider doing a computerized tomography of the chest.      Melissa L. Ladona Ridgel, MD  Electronically Signed     MLT/MEDQ  D:  07/18/2005  T:  07/18/2005  Job:  [id number]

## 2011-01-09 NOTE — Discharge Summary (Signed)
NAMEAYAHNA, SOLAZZO               ACCOUNT NO.:  [id number]   MEDICAL RECORD NO.:  [id number]          PATIENT TYPE:  INP   LOCATION:  5523                         FACILITY:  [facility]   PHYSICIAN:  Sherin Quarry, MD      DATE OF BIRTH:  1950/09/18   DATE OF ADMISSION:  07/18/2005  DATE OF DISCHARGE:  07/23/2005                                 DISCHARGE SUMMARY   Charlotte Stuart is a 60 year old lady who had been out of work for about the  last year and because of this problem was unable to obtain her medications.  She had not taken any medicines consistently for her diabetes for  approximately three months. On July 18, 2005, she presented to the [facility] Emergency Room complaining of abdominal pain and nausea. She was seen  in the emergency room by Dr. Derenda Mis who noted her blood pressure was  137/63, pulse was 117, respirations 22, O2 saturation 98%. HEENT exam was  within normal limits. The chest was remarkable for diminished breath sounds  diffusely. No wheezes or rhonchi or rales. Cardiovascular exam revealed  normal S1-S2 without rubs, murmurs or gallops. The abdomen was moderately  distended. Bowel sounds were very hypoactive. There were not high-pitched or  tinkling. The abdomen was nontender to palpation. Neurologic testing and  examination of the extremities was normal.   The initial laboratory studies were remarkable for A1c hemoglobin 11.9,  creatinine 1.1, BUN of 36. The glucose was 583, CO2 was 20. White count was  7800, hemoglobin was 12.8. X-ray studies showed dilated loops of small bowel  consistent with ileus or obstruction. Thus, the patient was admitted for  treatment of uncontrolled diabetes and abdominal ileus.   On admission, Dr. Ladona Ridgel initiated Glucomander protocol giving the patient  10 units of Regular insulin as a bolus and then starting a moderately  insulin sensitive protocol. The patient was given normal saline at 250  cc/hour. She was given  Zofran 4 milligrams every 6 hours p.r.n. for nausea  and Protonix 40 milligrams IV daily. She was also given Reglan 10 milligrams  IV every 6 hours. When the blood sugar came down into the desirable range,  the patient was placed on a sliding scale insulin regimen supplemented by  Lantus insulin. Subsequently, the patient experienced persistent vomiting  and after review of x-ray reports decision was made to place a nasogastric  tube. This gave her relief from her nausea and she was documented with  hypokalemia and therefore, potassium replacement was given by the  intravenous route.   By July 19, 2005, the patient was feeling much better. Her nasogastric  tube was clamped at that time.   By July 20, 2005, she was passing flatus and her nasogastric tube was  discontinued. She developed transient swelling of her left side of her face  and area around the left ear which was felt to be due to parotitis. This  responded to intravenous hydration and resolved quite quickly.   By July 21, 2005, the patient was doing well and therefore she was  advanced to a bland  soft carbohydrate-modified diet. As her x-ray showed  evidence of substantial stool in the colon, she was started on lactulose and  as a consequence had several apparently normal bowel movements. We discussed  the management of her diabetes. Previously, she had attempted to manage her  diabetes with the use of oral agents which she had difficulty taking  consistently. She felt very strongly that she did not want to take pills for  her diabetes. Instead, she wished to have her diabetes regulated with the  use of insulin. I strongly suggested to her that the best approach would be  to combine insulin therapy with oral hypoglycemic agents but she did not  wish to do this. Therefore I recommended that she be placed on a twice daily  insulin regimen. I emphasized to her that it was critical in patient who  take insulin that she  monitor her blood and see a physician on a regular  basis to adjust her dosage.   By July 23, 2005, it was felt reasonable to discharge the patient.   DISCHARGE DIAGNOSES:  1.  Small bowel ileus/constipation, resolved.  2.  Uncontrolled diabetes secondary to medication noncompliance.  3.  Hypokalemia, resolved noted by July 22, 2005 potassium level up to      3.9.  4.  Parotitis, resolved.   DISCHARGE MEDICATIONS:  The patient will continue Protonix 40 milligrams  p.o. daily, K-Dur 20 mEq, Chronulac 1-2 tablespoons daily with a stool  softener. In regards to her insulin therapy, I switched her to 70/30 insulin  at I felt this would be both economical and reasonably convenient. In the  hospital, she had been receiving a total of approximately 30 units of  insulin a day in the form of Lantus insulin and Humalog insulin administered  via sliding scale. Therefore, I suggested that she take 15 units of 70/30  insulin before breakfast and 15 units 70/30 insulin before supper. Again as  I previously stated, I emphasized to her that no matter what dose of insulin  she was sent home on, a doctor would need to see her regularly and adjust  this medication after her discharge. Condition at the time of discharge was  fair.           ______________________________  Sherin Quarry, MD     SY/MEDQ  D:  07/22/2005  T:  07/23/2005  Job:  [id number]   cc:   Renaye Rakers, M.D.  Fax: [phone]

## 2012-10-31 ENCOUNTER — Other Ambulatory Visit: Payer: Self-pay

## 2013-08-01 ENCOUNTER — Institutional Professional Consult (permissible substitution): Payer: Self-pay | Admitting: Pulmonary Disease

## 2013-09-11 ENCOUNTER — Institutional Professional Consult (permissible substitution): Payer: Self-pay | Admitting: Pulmonary Disease

## 2013-09-14 ENCOUNTER — Encounter: Payer: Self-pay | Admitting: Pulmonary Disease

## 2013-12-27 DIAGNOSIS — R609 Edema, unspecified: Secondary | ICD-10-CM | POA: Insufficient documentation

## 2013-12-27 DIAGNOSIS — E785 Hyperlipidemia, unspecified: Secondary | ICD-10-CM | POA: Insufficient documentation

## 2013-12-27 DIAGNOSIS — K219 Gastro-esophageal reflux disease without esophagitis: Secondary | ICD-10-CM | POA: Insufficient documentation

## 2013-12-27 DIAGNOSIS — J309 Allergic rhinitis, unspecified: Secondary | ICD-10-CM | POA: Insufficient documentation

## 2013-12-27 DIAGNOSIS — E119 Type 2 diabetes mellitus without complications: Secondary | ICD-10-CM | POA: Insufficient documentation

## 2013-12-27 DIAGNOSIS — K5900 Constipation, unspecified: Secondary | ICD-10-CM | POA: Insufficient documentation

## 2013-12-27 DIAGNOSIS — Z794 Long term (current) use of insulin: Secondary | ICD-10-CM

## 2014-07-18 ENCOUNTER — Other Ambulatory Visit: Payer: Self-pay | Admitting: Physician Assistant

## 2014-07-18 DIAGNOSIS — N63 Unspecified lump in unspecified breast: Secondary | ICD-10-CM

## 2014-07-18 DIAGNOSIS — N644 Mastodynia: Secondary | ICD-10-CM

## 2014-08-06 ENCOUNTER — Other Ambulatory Visit: Payer: Self-pay

## 2014-08-09 ENCOUNTER — Ambulatory Visit
Admission: RE | Admit: 2014-08-09 | Discharge: 2014-08-09 | Disposition: A | Discharge status: Home / Self Care | Payer: Medicare Other | Source: Ambulatory Visit | Attending: Physician Assistant | Admitting: Physician Assistant

## 2014-08-09 ENCOUNTER — Encounter (INDEPENDENT_AMBULATORY_CARE_PROVIDER_SITE_OTHER): Payer: Self-pay

## 2014-08-09 DIAGNOSIS — N63 Unspecified lump in unspecified breast: Secondary | ICD-10-CM

## 2014-08-09 DIAGNOSIS — N644 Mastodynia: Secondary | ICD-10-CM

## 2014-09-12 ENCOUNTER — Other Ambulatory Visit: Payer: Self-pay | Admitting: Physician Assistant

## 2014-09-12 DIAGNOSIS — N644 Mastodynia: Secondary | ICD-10-CM

## 2014-09-12 DIAGNOSIS — N63 Unspecified lump in unspecified breast: Secondary | ICD-10-CM

## 2015-06-26 DIAGNOSIS — H3342 Traction detachment of retina, left eye: Secondary | ICD-10-CM | POA: Insufficient documentation

## 2015-09-16 ENCOUNTER — Other Ambulatory Visit: Payer: Self-pay

## 2015-09-16 DIAGNOSIS — Z1231 Encounter for screening mammogram for malignant neoplasm of breast: Secondary | ICD-10-CM

## 2015-09-22 DIAGNOSIS — E785 Hyperlipidemia, unspecified: Secondary | ICD-10-CM | POA: Insufficient documentation

## 2015-09-22 DIAGNOSIS — E559 Vitamin D deficiency, unspecified: Secondary | ICD-10-CM | POA: Insufficient documentation

## 2015-09-22 DIAGNOSIS — Z794 Long term (current) use of insulin: Secondary | ICD-10-CM | POA: Insufficient documentation

## 2015-09-22 DIAGNOSIS — M542 Cervicalgia: Secondary | ICD-10-CM | POA: Insufficient documentation

## 2015-10-03 ENCOUNTER — Ambulatory Visit
Admission: RE | Admit: 2015-10-03 | Discharge: 2015-10-03 | Disposition: A | Discharge status: Home / Self Care | Payer: Medicare HMO | Source: Ambulatory Visit

## 2015-10-03 DIAGNOSIS — Z1231 Encounter for screening mammogram for malignant neoplasm of breast: Secondary | ICD-10-CM

## 2015-10-22 DIAGNOSIS — R011 Cardiac murmur, unspecified: Secondary | ICD-10-CM | POA: Insufficient documentation

## 2016-01-13 ENCOUNTER — Encounter: Payer: Medicare HMO | Admitting: Sports Medicine

## 2016-01-21 NOTE — Progress Notes (Signed)
This encounter was created in error - please disregard.

## 2016-02-17 ENCOUNTER — Ambulatory Visit: Payer: Medicare HMO | Admitting: Sports Medicine

## 2016-08-10 ENCOUNTER — Encounter: Payer: Self-pay | Admitting: Podiatry

## 2016-08-10 ENCOUNTER — Ambulatory Visit (INDEPENDENT_AMBULATORY_CARE_PROVIDER_SITE_OTHER): Payer: Medicare HMO | Admitting: Podiatry

## 2016-08-10 DIAGNOSIS — L603 Nail dystrophy: Secondary | ICD-10-CM

## 2016-08-10 DIAGNOSIS — M79609 Pain in unspecified limb: Secondary | ICD-10-CM | POA: Diagnosis not present

## 2016-08-10 DIAGNOSIS — L608 Other nail disorders: Secondary | ICD-10-CM | POA: Diagnosis not present

## 2016-08-10 DIAGNOSIS — Z79899 Other long term (current) drug therapy: Secondary | ICD-10-CM

## 2016-08-10 DIAGNOSIS — B351 Tinea unguium: Secondary | ICD-10-CM | POA: Diagnosis not present

## 2016-08-10 NOTE — Progress Notes (Signed)
   Subjective: Patient presents today for possible treatment and evaluation of fungal nails bilaterally 1 through 5. Patient states that the nails have been discolored and thickened for greater than 1 month. Patient presents today for further treatment and evaluation.  Objective: Physical Exam General: The patient is alert and oriented x3 in no acute distress.  Dermatology: Hyperkeratotic, discolored, thickened, onychodystrophy of nails noted bilaterally.  Skin is warm, dry and supple bilateral lower extremities. Negative for open lesions or macerations.  Vascular: Palpable pedal pulses bilaterally. No edema or erythema noted. Capillary refill within normal limits.  Neurological: Epicritic and protective threshold grossly intact bilaterally.   Musculoskeletal Exam: Range of motion within normal limits to all pedal and ankle joints bilateral. Muscle strength 5/5 in all groups bilateral.   Assessment: #1 onychodystrophy bilateral toenails #2 possible onychomycosis #3 hyperkeratotic nails bilateral  Plan of Care:  #1 Patient was evaluated. #2 Orders for liver function tests were ordered today.  #3 Today nail biopsy was taken and sent to pathology for fungal culture. #5 patient is to return to clinic in 4 weeks to discuss fungal culture nail biopsy findings and LFT results and discuss different treatment options.   Brent M. Evans, DPM [facility]  Dr. Brent M. Evans, DPM    [address]                                        [place], [place] [address]                Office [phone]  Fax [phone]      

## 2016-08-31 ENCOUNTER — Ambulatory Visit: Payer: Medicare HMO | Admitting: Podiatry

## 2016-12-22 DIAGNOSIS — H93A1 Pulsatile tinnitus, right ear: Secondary | ICD-10-CM | POA: Insufficient documentation

## 2016-12-25 DIAGNOSIS — H9203 Otalgia, bilateral: Secondary | ICD-10-CM | POA: Insufficient documentation

## 2016-12-25 DIAGNOSIS — H9041 Sensorineural hearing loss, unilateral, right ear, with unrestricted hearing on the contralateral side: Secondary | ICD-10-CM | POA: Insufficient documentation

## 2017-02-09 ENCOUNTER — Other Ambulatory Visit: Payer: Self-pay | Admitting: Internal Medicine

## 2017-02-09 DIAGNOSIS — Z1231 Encounter for screening mammogram for malignant neoplasm of breast: Secondary | ICD-10-CM

## 2017-02-15 ENCOUNTER — Other Ambulatory Visit: Payer: Self-pay | Admitting: Internal Medicine

## 2017-02-15 DIAGNOSIS — N63 Unspecified lump in unspecified breast: Secondary | ICD-10-CM

## 2017-02-17 ENCOUNTER — Ambulatory Visit: Payer: Medicare HMO | Admitting: Podiatry

## 2017-02-18 ENCOUNTER — Ambulatory Visit
Admission: RE | Admit: 2017-02-18 | Discharge: 2017-02-18 | Disposition: A | Discharge status: Home / Self Care | Payer: Medicare HMO | Source: Ambulatory Visit | Attending: Internal Medicine | Admitting: Internal Medicine

## 2017-02-18 DIAGNOSIS — N63 Unspecified lump in unspecified breast: Secondary | ICD-10-CM

## 2017-04-08 DIAGNOSIS — R0609 Other forms of dyspnea: Secondary | ICD-10-CM

## 2017-04-09 DIAGNOSIS — H3321 Serous retinal detachment, right eye: Secondary | ICD-10-CM | POA: Insufficient documentation

## 2017-04-12 ENCOUNTER — Ambulatory Visit: Payer: Medicare HMO | Admitting: Podiatry

## 2017-04-19 ENCOUNTER — Ambulatory Visit (INDEPENDENT_AMBULATORY_CARE_PROVIDER_SITE_OTHER): Payer: Medicare HMO | Admitting: Podiatry

## 2017-04-19 DIAGNOSIS — M79676 Pain in unspecified toe(s): Secondary | ICD-10-CM | POA: Diagnosis not present

## 2017-04-19 DIAGNOSIS — B351 Tinea unguium: Secondary | ICD-10-CM | POA: Diagnosis not present

## 2017-04-19 DIAGNOSIS — R002 Palpitations: Secondary | ICD-10-CM | POA: Insufficient documentation

## 2017-04-19 MED ORDER — NONFORMULARY OR COMPOUNDED ITEM: 0 refills | Status: DC

## 2017-04-19 NOTE — Addendum Note (Signed)
Addended by: Marcell Anger on: 04/19/2017 01:52 PM   Modules accepted: Orders

## 2017-04-19 NOTE — Progress Notes (Signed)
   SUBJECTIVE Patient  presents to office today complaining of elongated, thickened nails. Pain while ambulating in shoes. Patient is unable to trim their own nails.   OBJECTIVE General Patient is awake, alert, and oriented x 3 and in no acute distress. Derm Skin is dry and supple bilateral. Negative open lesions or macerations. Remaining integument unremarkable. Nails are tender, long, thickened and dystrophic with subungual debris, consistent with onychomycosis, 1-5 bilateral. No signs of infection noted. Vasc  DP and PT pedal pulses palpable bilaterally. Temperature gradient within normal limits.  Neuro Epicritic and protective threshold sensation diminished bilaterally.  Musculoskeletal Exam No symptomatic pedal deformities noted bilateral. Muscular strength within normal limits.  ASSESSMENT 1. Onychodystrophic nails 1-5 bilateral with hyperkeratosis of nails.  2. Onychomycosis of nail due to dermatophyte bilateral 3. Pain in foot bilateral  PLAN OF CARE 1. Patient evaluated today.  2. Instructed to maintain good pedal hygiene and foot care.  3. Mechanical debridement of nails 1-5 bilaterally performed using a nail nipper. Filed with dremel without incident.  4. The patient would like to pursue topical antifungal medication. Prescription for antifungal nail lacquer through [facility]. 5. Return to clinic in 6 mos.    Felecia Shelling, DPM [facility]  Dr. Felecia Shelling, DPM    [address]                                        [place], [place] [address]                Office [phone]  Fax [phone]

## 2017-07-19 ENCOUNTER — Ambulatory Visit: Payer: Medicare HMO | Admitting: Podiatry

## 2017-08-10 DIAGNOSIS — R400 Somnolence: Secondary | ICD-10-CM | POA: Insufficient documentation

## 2017-10-18 ENCOUNTER — Ambulatory Visit: Payer: Medicare HMO | Admitting: Podiatry

## 2017-10-27 ENCOUNTER — Ambulatory Visit (INDEPENDENT_AMBULATORY_CARE_PROVIDER_SITE_OTHER): Payer: Medicare HMO | Admitting: Podiatry

## 2017-10-27 ENCOUNTER — Encounter: Payer: Self-pay | Admitting: Podiatry

## 2017-10-27 DIAGNOSIS — M79676 Pain in unspecified toe(s): Secondary | ICD-10-CM | POA: Diagnosis not present

## 2017-10-27 DIAGNOSIS — B351 Tinea unguium: Secondary | ICD-10-CM

## 2017-10-27 DIAGNOSIS — E0843 Diabetes mellitus due to underlying condition with diabetic autonomic (poly)neuropathy: Secondary | ICD-10-CM | POA: Diagnosis not present

## 2017-10-27 MED ORDER — NONFORMULARY OR COMPOUNDED ITEM: 2 refills | Status: DC

## 2017-10-31 NOTE — Progress Notes (Signed)
   SUBJECTIVE 67 year old female with a history of diabetes mellitus presents to office today complaining of elongated, thickened nails that cause pain while ambulating in shoes. She is unable to trim her own nails. She is also requesting a refill on the antifungal nail lacquer from [facility]. Patient is here for further evaluation and treatment.   No past medical history on file.  OBJECTIVE General Patient is awake, alert, and oriented x 3 and in no acute distress. Derm Skin is dry and supple bilateral. Negative open lesions or macerations. Remaining integument unremarkable. Nails are tender, long, thickened and dystrophic with subungual debris, consistent with onychomycosis, 1-5 bilateral. No signs of infection noted. Vasc  DP and PT pedal pulses palpable bilaterally. Temperature gradient within normal limits.  Neuro Epicritic and protective threshold sensation diminished bilaterally.  Musculoskeletal Exam No symptomatic pedal deformities noted bilateral. Muscular strength within normal limits.  ASSESSMENT 1. Diabetes Mellitus w/ peripheral neuropathy 2. Onychomycosis of nail due to dermatophyte bilateral 3. Pain in foot bilateral  PLAN OF CARE 1. Patient evaluated today. 2. Instructed to maintain good pedal hygiene and foot care. Stressed importance of controlling blood sugar.  3. Mechanical debridement of nails 1-5 bilaterally performed using a nail nipper. Filed with dremel without incident.  4. Refill prescription for antifungal nail lacquer to be dispensed from [facility].  5. Return to clinic in 3 mos.     Felecia ShellingBrent M. Letonia Stead, DPM [facility]  Dr. Felecia ShellingBrent M. Tuyen Uncapher, DPM    [address]                                        [place], [place] [id number]                Office [phone]  Fax [phone]

## 2017-12-20 ENCOUNTER — Institutional Professional Consult (permissible substitution): Payer: Medicare HMO | Admitting: Neurology

## 2017-12-20 ENCOUNTER — Telehealth: Payer: Self-pay

## 2017-12-20 NOTE — Telephone Encounter (Signed)
Pt did not show for their appt with Dr. Athar today.  

## 2017-12-21 ENCOUNTER — Encounter: Payer: Self-pay | Admitting: Neurology

## 2017-12-23 IMAGING — MG MM DIGITAL SCREENING BILAT W/ TOMO W/ CAD
6 of 10 series · 6 of 26 positions shown · non-contrast
Comparison: Previous exam(s).

CLINICAL DATA: Screening.

EXAM:
DIGITAL SCREENING BILATERAL MAMMOGRAM WITH 3D TOMO WITH CAD

[L MLO (1 of 2)]
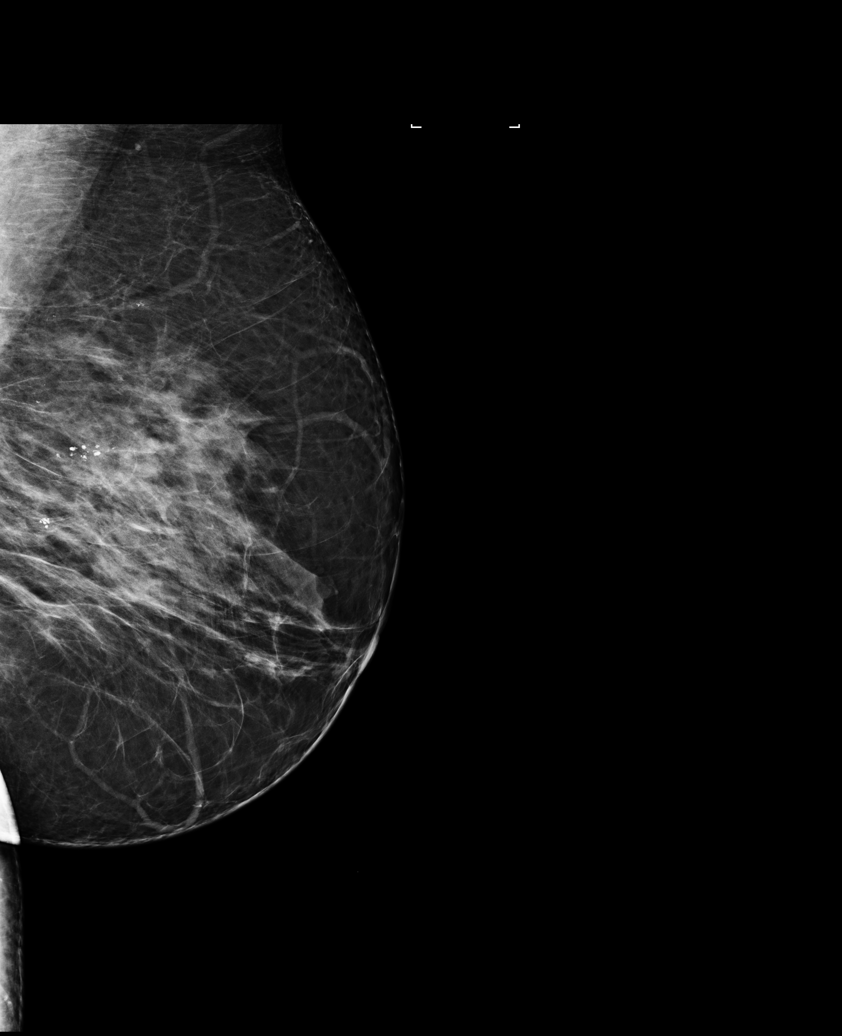

[R MLO (1 of 2)]
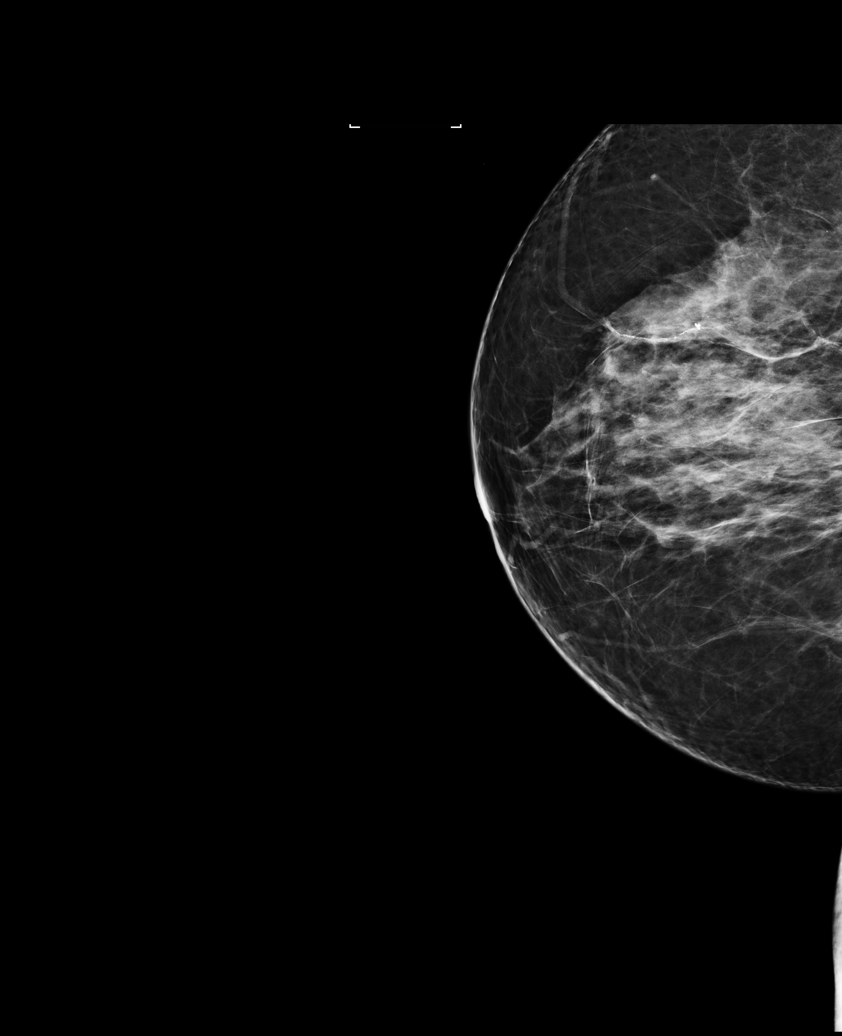

[R CC]
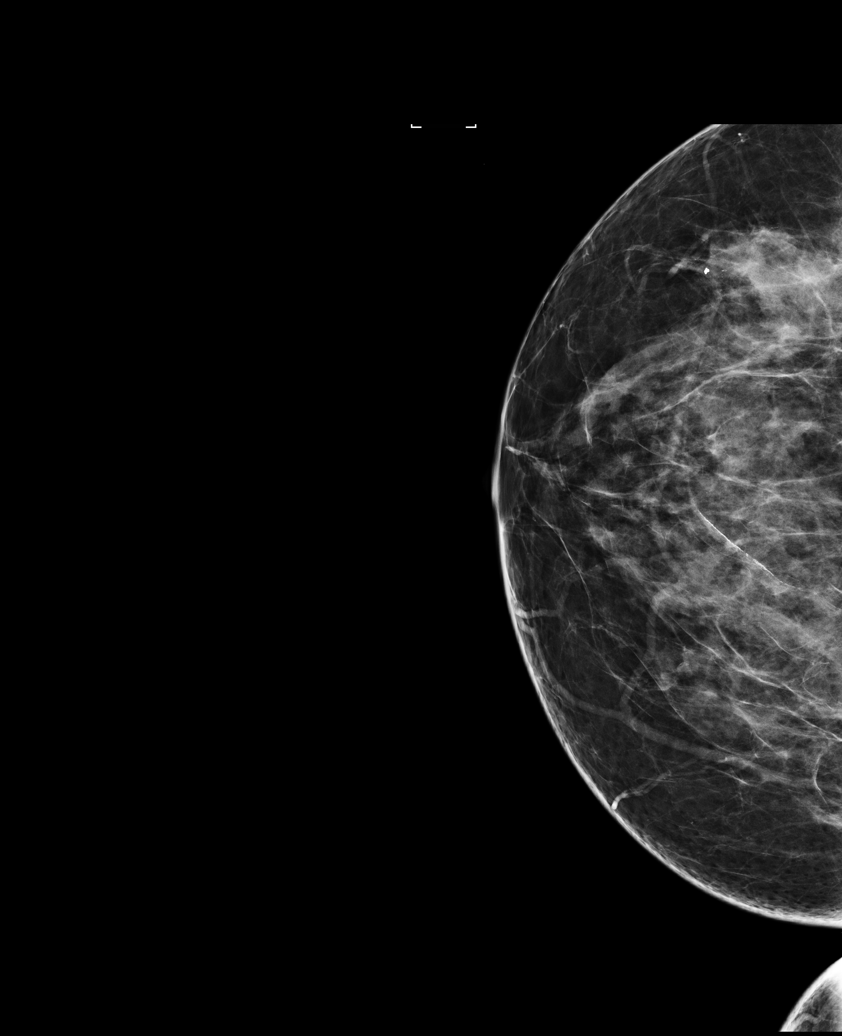

[L CC]
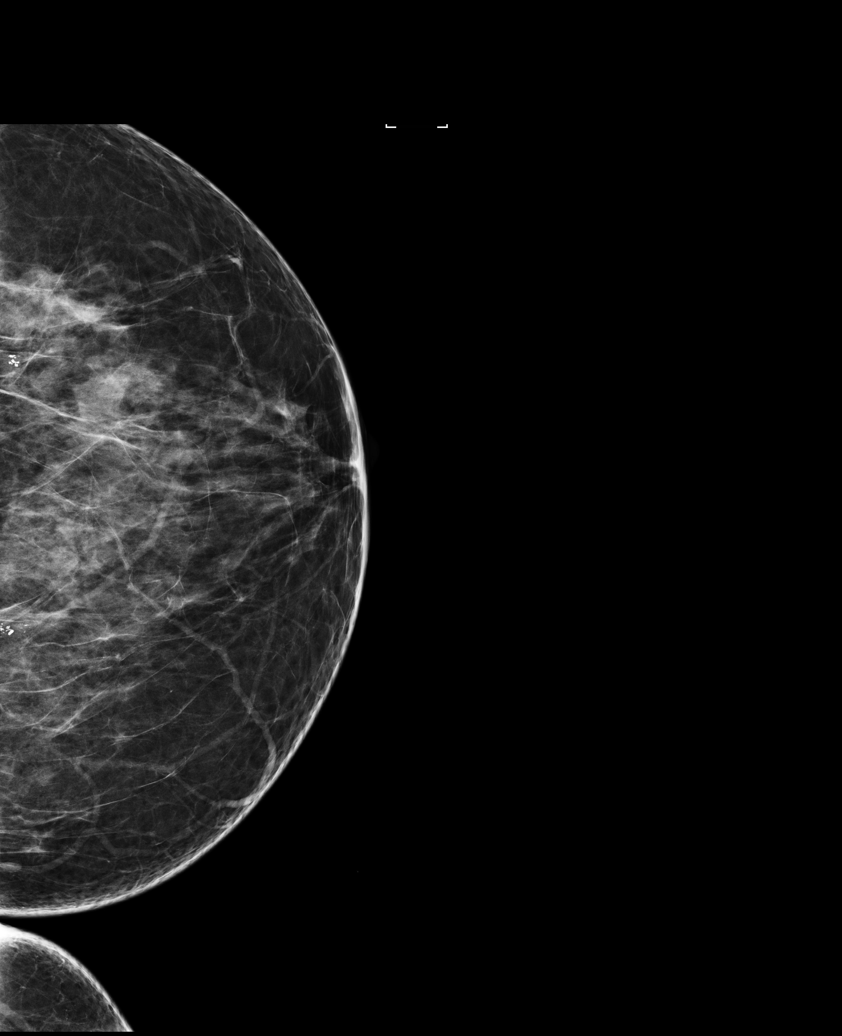

[L MLO (2 of 2)]
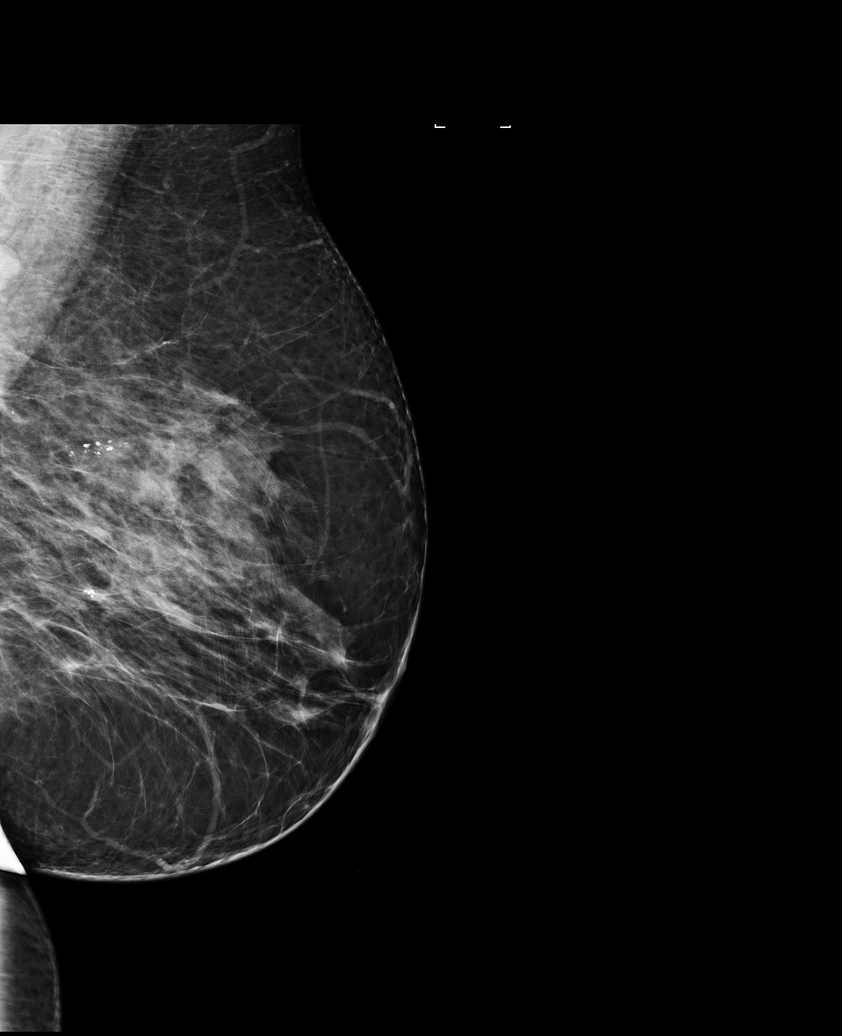

[R MLO (2 of 2)]
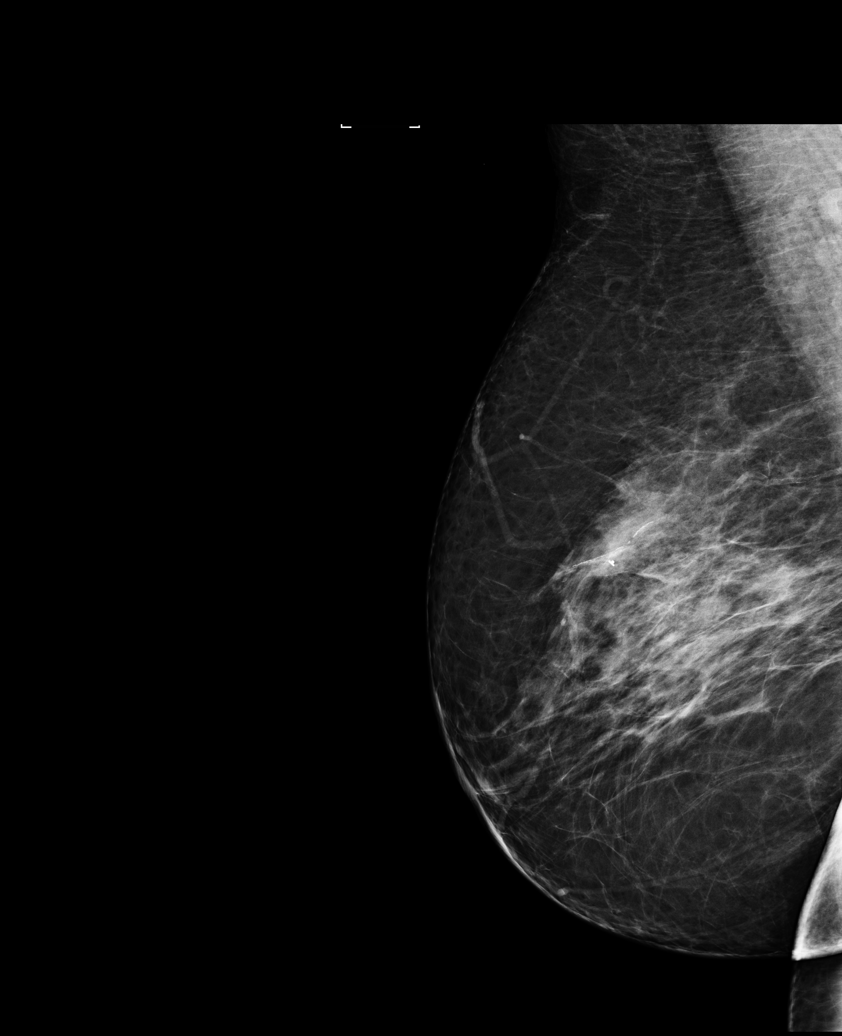

[6 of 26 positions shown; findings below may reference images not displayed]

ACR Breast Density Category c: The breast tissue is heterogeneously
dense, which may obscure small masses.
FINDINGS: There are no findings suspicious for malignancy. Images were
processed with CAD.
IMPRESSION: No mammographic evidence of malignancy. A result letter of this
screening mammogram will be mailed directly to the patient.

RECOMMENDATION:
Screening mammogram in one year. (Code:OA-G-1SS)

BI-RADS CATEGORY  1: Negative.

## 2018-01-04 ENCOUNTER — Other Ambulatory Visit: Payer: Self-pay | Admitting: Ophthalmology

## 2018-01-26 ENCOUNTER — Ambulatory Visit: Payer: Medicare HMO | Admitting: Podiatry

## 2018-02-07 ENCOUNTER — Encounter: Payer: Medicare HMO | Admitting: Podiatry

## 2018-02-14 NOTE — Progress Notes (Signed)
This encounter was created in error - please disregard.

## 2018-04-18 ENCOUNTER — Other Ambulatory Visit: Payer: Self-pay | Admitting: Internal Medicine

## 2018-04-18 DIAGNOSIS — Z1239 Encounter for other screening for malignant neoplasm of breast: Secondary | ICD-10-CM

## 2018-05-23 ENCOUNTER — Encounter: Payer: Medicare HMO | Admitting: Podiatry

## 2018-05-30 NOTE — Progress Notes (Signed)
This encounter was created in error - please disregard.

## 2018-06-14 ENCOUNTER — Ambulatory Visit: Payer: Medicare HMO

## 2018-08-01 ENCOUNTER — Ambulatory Visit (INDEPENDENT_AMBULATORY_CARE_PROVIDER_SITE_OTHER): Payer: Medicare HMO | Admitting: Podiatry

## 2018-08-01 DIAGNOSIS — E1142 Type 2 diabetes mellitus with diabetic polyneuropathy: Secondary | ICD-10-CM | POA: Diagnosis not present

## 2018-08-01 DIAGNOSIS — M79676 Pain in unspecified toe(s): Secondary | ICD-10-CM

## 2018-08-01 DIAGNOSIS — B351 Tinea unguium: Secondary | ICD-10-CM

## 2018-08-01 NOTE — Patient Instructions (Signed)

## 2018-09-03 ENCOUNTER — Encounter: Payer: Self-pay | Admitting: Podiatry

## 2018-09-03 NOTE — Progress Notes (Signed)
Subjective: Charlotte Stuart presents today with history of neuropathy with cc of painful, mycotic toenails.  Pain is aggravated when wearing enclosed shoe gear and relieved with periodic professional debridement.   Current Outpatient Medications:  .  ACCU-CHEK SMARTVIEW test strip, USE TO TEST BLOOD SUGAR TID, Disp: , Rfl: 0 .  albuterol (PROAIR HFA) 108 (90 Base) MCG/ACT inhaler, Inhale into the lungs., Disp: , Rfl:  .  amoxicillin-clavulanate (AUGMENTIN) 875-125 MG tablet, TK 1 T PO BID FOR 10 DAYS, Disp: , Rfl: 0 .  aspirin EC 81 MG tablet, Take 81 mg by mouth daily., Disp: , Rfl:  .  atorvastatin (LIPITOR) 20 MG tablet, , Disp: , Rfl:  .  atorvastatin (LIPITOR) 20 MG tablet, TK 1 T PO HS, Disp: , Rfl: 3 .  atropine 1 % ophthalmic solution, , Disp: , Rfl:  .  BD PEN NEEDLE NANO U/F 32G X 4 MM MISC, U UTD WITH VICTOZA, Disp: , Rfl: 5 .  brimonidine (ALPHAGAN) 0.2 % ophthalmic solution, , Disp: , Rfl:  .  bumetanide (BUMEX) 2 MG tablet, , Disp: , Rfl:  .  Cholecalciferol (VITAMIN D) 50 MCG (2000 UT) tablet, Take by mouth., Disp: , Rfl:  .  ciprofloxacin (CILOXAN) 0.3 % ophthalmic solution, , Disp: , Rfl:  .  Continuous Blood Gluc Receiver (FREESTYLE LIBRE READER) DEVI, Scan as needed up to 3 times per day as directed. Dx E11.65, Disp: , Rfl:  .  Continuous Blood Gluc Sensor (FREESTYLE LIBRE 14 DAY SENSOR) MISC, FreeStyle Libre 14 Day Sensor kit, Disp: , Rfl:  .  Continuous Blood Gluc Sensor (FREESTYLE LIBRE SENSOR SYSTEM) MISC, Inject into the skin., Disp: , Rfl:  .  diazepam (VALIUM) 5 MG tablet, diazepam 5 mg tablet, Disp: , Rfl:  .  Difluprednate (DUREZOL) 0.05 % EMUL, INT 1 GTT INTO THE LEFT EYE BID PRF DISCOMFORT, Disp: , Rfl:  .  FLUVIRIN SUSP, ADM 0.5ML IM UTD, Disp: , Rfl: 0 .  furosemide (LASIX) 40 MG tablet, TK 1 T PO  TWICE A WEEK PRN FOR FLUID RETENTION, Disp: , Rfl: 2 .  glipiZIDE (GLUCOTROL XL) 5 MG 24 hr tablet, Take 5 mg by mouth daily with breakfast., Disp: , Rfl:  .   glucose blood test strip, Accu-Chek SmartView Test Strips, Disp: , Rfl:  .  glucose blood test strip, USE TO TEST BLOOD SUGAR THREE TIMES DAILY, Disp: , Rfl:  .  glucose blood test strip, USE TO TEST BLOOD SUGAR THREE TIMES DAILY, Disp: , Rfl:  .  insulin regular human CONCENTRATED (HUMULIN R) 500 UNIT/ML injection, , Disp: , Rfl:  .  Insulin Syringe-Needle U-100 (INSULIN SYRINGE .5CC/31GX5/16") 31G X 5/16" 0.5 ML MISC, , Disp: , Rfl:  .  Insulin Syringe-Needle U-100 (INSULIN SYRINGE 1CC/31GX5/16") 31G X 5/16" 1 ML MISC, , Disp: , Rfl:  .  linaclotide (LINZESS) 145 MCG CAPS capsule, , Disp: , Rfl:  .  linaclotide (LINZESS) 145 MCG CAPS capsule, Take by mouth., Disp: , Rfl:  .  lisinopril-hydrochlorothiazide (PRINZIDE,ZESTORETIC) 10-12.5 MG tablet, , Disp: , Rfl:  .  LORazepam (ATIVAN) 1 MG tablet, lorazepam 1 mg tablet, Disp: , Rfl:  .  metFORMIN (GLUCOPHAGE-XR) 500 MG 24 hr tablet, Take 500 mg by mouth daily with breakfast., Disp: , Rfl:  .  methylPREDNISolone (MEDROL DOSEPAK) 4 MG TBPK tablet, , Disp: , Rfl:  .  metoCLOPramide (REGLAN) 5 MG tablet, Take 5 mg by mouth., Disp: , Rfl:  .  metoprolol succinate (TOPROL XL)  25 MG 24 hr tablet, Take 25 mg by mouth., Disp: , Rfl:  .  neomycin-polymyxin b-dexamethasone (MAXITROL) 3.5-10000-0.1 OINT, APPLY A SMALL AMOUNT ONTO SUTURES OR OPERATIVE SITE IN AFFECTED EYE BID FOR 3 DAYS THEN STOP, Disp: , Rfl: 0 .  NONFORMULARY OR COMPOUNDED ITEM, [facility]  Onychomycosis Nail Lacquer -  Fluconazole 2%, Terbinafine 1% DMSO Apply to affected nail once daily Qty. 120 gm 3 refills, Disp: 1 each, Rfl: 0 .  NONFORMULARY OR COMPOUNDED ITEM, [facility]  Onychomycosis Nail Lacquer -  Fluconazole 2%, Terbinafine 1% DMSO Apply to affected nail once daily Qty. 120 gm 3 refills, Disp: 1 each, Rfl: 2 .  omeprazole (PRILOSEC) 20 MG capsule, , Disp: , Rfl:  .  omeprazole (PRILOSEC) 20 MG capsule, , Disp: , Rfl: 11 .  omeprazole (PRILOSEC) 40 MG capsule, Take  40 mg by mouth daily., Disp: , Rfl:  .  oxyCODONE-acetaminophen (PERCOCET/ROXICET) 5-325 MG tablet, , Disp: , Rfl:  .  prednisoLONE acetate (PRED FORTE) 1 % ophthalmic suspension, , Disp: , Rfl:  .  tobramycin-dexamethasone (TOBRADEX) ophthalmic solution, TobraDex 0.3 %-0.1 % eye drops,suspension  INSTILL 1 DROP INTO left EYE(S) BY OPHTHALMIC ROUTE EVERY 6 HOURS for 7 days, Disp: , Rfl:  .  traMADol (ULTRAM) 50 MG tablet, , Disp: , Rfl:  .  VICTOZA 18 MG/3ML SOPN, , Disp: , Rfl:  .  Vitamin D, Ergocalciferol, (DRISDOL) 50000 units CAPS capsule, , Disp: , Rfl:   Allergies  Allergen Reactions  . Iodinated Diagnostic Agents Itching and Swelling    Burning, swelling burns    Objective:  Vascular Examination: Capillary refill time immediate x 10 digits Dorsalis pedis and Posterior tibial pulses Digital hair x 10 digits was absent Skin temperature gradient WNL b/l  Dermatological Examination: Skin with normal turgor, texture and tone b/l  Toenails 1-5 b/l discolored, thick, dystrophic with subungual debris and pain with palpation to nailbeds due to thickness of nails.  Musculoskeletal: Muscle strength 5/5 to all muscle groups b/l  Neurological: Sensation with 10 gram monofilament is absent b/l Vibratory sensation absent b/l  Assessment: 1. Painful onychomycosis toenails 1-5 b/l 2. NIDDM with neuropathy  Plan: 1. Toenails 1-5 b/l were debrided in length and girth without iatrogenic bleeding. Continue compounded antifungal topical daily for toenails. 2. Patient to continue soft, supportive shoe gear 3. Patient to report any pedal injuries to medical professional  4. Follow up 3 months.  5. Patient/POA to call should there be a concern in the interim.

## 2018-11-01 ENCOUNTER — Ambulatory Visit: Payer: Medicare HMO | Admitting: Podiatry

## 2018-11-24 ENCOUNTER — Ambulatory Visit: Payer: Medicare HMO | Admitting: Podiatry

## 2018-11-29 ENCOUNTER — Ambulatory Visit: Payer: Medicare HMO

## 2019-01-11 ENCOUNTER — Ambulatory Visit: Payer: Medicare HMO

## 2019-01-17 ENCOUNTER — Ambulatory Visit: Payer: Medicare HMO

## 2019-02-28 ENCOUNTER — Ambulatory Visit: Payer: Medicare HMO

## 2019-03-14 ENCOUNTER — Other Ambulatory Visit: Payer: Self-pay

## 2019-03-14 ENCOUNTER — Ambulatory Visit (INDEPENDENT_AMBULATORY_CARE_PROVIDER_SITE_OTHER): Payer: Medicare HMO | Admitting: Podiatry

## 2019-03-14 DIAGNOSIS — E1142 Type 2 diabetes mellitus with diabetic polyneuropathy: Secondary | ICD-10-CM | POA: Diagnosis not present

## 2019-03-14 DIAGNOSIS — B351 Tinea unguium: Secondary | ICD-10-CM

## 2019-03-14 DIAGNOSIS — M79676 Pain in unspecified toe(s): Secondary | ICD-10-CM

## 2019-03-14 NOTE — Patient Instructions (Signed)
Diabetes Mellitus and Foot Care Foot care is an important part of your health, especially when you have diabetes. Diabetes may cause you to have problems because of poor blood flow (circulation) to your feet and legs, which can cause your skin to:  Become thinner and drier.  Break more easily.  Heal more slowly.  Peel and crack. You may also have nerve damage (neuropathy) in your legs and feet, causing decreased feeling in them. This means that you may not notice minor injuries to your feet that could lead to more serious problems. Noticing and addressing any potential problems early is the best way to prevent future foot problems. How to care for your feet Foot hygiene  Wash your feet daily with warm water and mild soap. Do not use hot water. Then, pat your feet and the areas between your toes until they are completely dry. Do not soak your feet as this can dry your skin.  Trim your toenails straight across. Do not dig under them or around the cuticle. File the edges of your nails with an emery board or nail file.  Apply a moisturizing lotion or petroleum jelly to the skin on your feet and to dry, brittle toenails. Use lotion that does not contain alcohol and is unscented. Do not apply lotion between your toes. Shoes and socks  Wear clean socks or stockings every day. Make sure they are not too tight. Do not wear knee-high stockings since they may decrease blood flow to your legs.  Wear shoes that fit properly and have enough cushioning. Always look in your shoes before you put them on to be sure there are no objects inside.  To break in new shoes, wear them for just a few hours a day. This prevents injuries on your feet. Wounds, scrapes, corns, and calluses  Check your feet daily for blisters, cuts, bruises, sores, and redness. If you cannot see the bottom of your feet, use a mirror or ask someone for help.  Do not cut corns or calluses or try to remove them with medicine.  If you  find a minor scrape, cut, or break in the skin on your feet, keep it and the skin around it clean and dry. You may clean these areas with mild soap and water. Do not clean the area with peroxide, alcohol, or iodine.  If you have a wound, scrape, corn, or callus on your foot, look at it several times a day to make sure it is healing and not infected. Check for: ? Redness, swelling, or pain. ? Fluid or blood. ? Warmth. ? Pus or a bad smell. General instructions  Do not cross your legs. This may decrease blood flow to your feet.  Do not use heating pads or hot water bottles on your feet. They may burn your skin. If you have lost feeling in your feet or legs, you may not know this is happening until it is too late.  Protect your feet from hot and cold by wearing shoes, such as at the beach or on hot pavement.  Schedule a complete foot exam at least once a year (annually) or more often if you have foot problems. If you have foot problems, report any cuts, sores, or bruises to your health care provider immediately. Contact a health care provider if:  You have a medical condition that increases your risk of infection and you have any cuts, sores, or bruises on your feet.  You have an injury that is not   healing.  You have redness on your legs or feet.  You feel burning or tingling in your legs or feet.  You have pain or cramps in your legs and feet.  Your legs or feet are numb.  Your feet always feel cold.  You have pain around a toenail. Get help right away if:  You have a wound, scrape, corn, or callus on your foot and: ? You have pain, swelling, or redness that gets worse. ? You have fluid or blood coming from the wound, scrape, corn, or callus. ? Your wound, scrape, corn, or callus feels warm to the touch. ? You have pus or a bad smell coming from the wound, scrape, corn, or callus. ? You have a fever. ? You have a red line going up your leg. Summary  Check your feet every day  for cuts, sores, red spots, swelling, and blisters.  Moisturize feet and legs daily.  Wear shoes that fit properly and have enough cushioning.  If you have foot problems, report any cuts, sores, or bruises to your health care provider immediately.  Schedule a complete foot exam at least once a year (annually) or more often if you have foot problems. This information is not intended to replace advice given to you by your health care provider. Make sure you discuss any questions you have with your health care provider. Document Released: 08/07/2000 Document Revised: 09/22/2017 Document Reviewed: 09/11/2016 Elsevier Patient Education  2020 [employer]   Onychomycosis/Fungal Toenails  WHAT IS IT? An infection that lies within the keratin of your nail plate that is caused by a fungus.  WHY ME? Fungal infections affect all ages, sexes, races, and creeds.  There may be many factors that predispose you to a fungal infection such as age, coexisting medical conditions such as diabetes, or an autoimmune disease; stress, medications, fatigue, genetics, etc.  Bottom line: fungus thrives in a warm, moist environment and your shoes offer such a location.  IS IT CONTAGIOUS? Theoretically, yes.  You do not want to share shoes, nail clippers or files with someone who has fungal toenails.  Walking around barefoot in the same room or sleeping in the same bed is unlikely to transfer the organism.  It is important to realize, however, that fungus can spread easily from one nail to the next on the same foot.  HOW DO WE TREAT THIS?  There are several ways to treat this condition.  Treatment may depend on many factors such as age, medications, pregnancy, liver and kidney conditions, etc.  It is best to ask your doctor which options are available to you.  1. No treatment.   Unlike many other medical concerns, you can live with this condition.  However for many people this can be a painful condition and may lead to  ingrown toenails or a bacterial infection.  It is recommended that you keep the nails cut short to help reduce the amount of fungal nail. 2. Topical treatment.  These range from herbal remedies to prescription strength nail lacquers.  About 40-50% effective, topicals require twice daily application for approximately 9 to 12 months or until an entirely new nail has grown out.  The most effective topicals are medical grade medications available through physicians offices. 3. Oral antifungal medications.  With an 80-90% cure rate, the most common oral medication requires 3 to 4 months of therapy and stays in your system for a year as the new nail grows out.  Oral antifungal medications do require   blood work to make sure it is a safe drug for you.  A liver function panel will be performed prior to starting the medication and after the first month of treatment.  It is important to have the blood work performed to avoid any harmful side effects.  In general, this medication safe but blood work is required. 4. Laser Therapy.  This treatment is performed by applying a specialized laser to the affected nail plate.  This therapy is noninvasive, fast, and non-painful.  It is not covered by insurance and is therefore, out of pocket.  The results have been very good with a 80-95% cure rate.  [facility] is the only practice in the area to offer this therapy. 5. Permanent Nail Avulsion.  Removing the entire nail so that a new nail will not grow back. 

## 2019-03-19 ENCOUNTER — Encounter: Payer: Self-pay | Admitting: Podiatry

## 2019-03-19 NOTE — Progress Notes (Signed)
Subjective:  Charlotte Stuart presents to clinic today for preventative diabetic foot care with cc of  painful, thick, discolored, elongated toenails 1-5 b/l that become tender and cannot cut because of thickness. Pain is aggravated when wearing enclosed shoe gear and relieved with periodic professional debridement.   She voices no new pedal problems on today's visit.  She states she has been having transportation problems, but her daughter brought her on today.   Current Outpatient Medications:  .  ACCU-CHEK SMARTVIEW test strip, USE TO TEST BLOOD SUGAR TID, Disp: , Rfl: 0 .  albuterol (PROAIR HFA) 108 (90 Base) MCG/ACT inhaler, Inhale into the lungs., Disp: , Rfl:  .  amoxicillin-clavulanate (AUGMENTIN) 875-125 MG tablet, TK 1 T PO BID FOR 10 DAYS, Disp: , Rfl: 0 .  aspirin EC 81 MG tablet, Take 81 mg by mouth daily., Disp: , Rfl:  .  atorvastatin (LIPITOR) 20 MG tablet, , Disp: , Rfl:  .  atorvastatin (LIPITOR) 20 MG tablet, TK 1 T PO HS, Disp: , Rfl: 3 .  atropine 1 % ophthalmic solution, , Disp: , Rfl:  .  BD PEN NEEDLE NANO U/F 32G X 4 MM MISC, U UTD WITH VICTOZA, Disp: , Rfl: 5 .  brimonidine (ALPHAGAN) 0.2 % ophthalmic solution, , Disp: , Rfl:  .  bumetanide (BUMEX) 2 MG tablet, , Disp: , Rfl:  .  Cholecalciferol (VITAMIN D) 50 MCG (2000 UT) tablet, Take by mouth., Disp: , Rfl:  .  ciprofloxacin (CILOXAN) 0.3 % ophthalmic solution, , Disp: , Rfl:  .  Continuous Blood Gluc Receiver (FREESTYLE LIBRE READER) DEVI, Scan as needed up to 3 times per day as directed. Dx E11.65, Disp: , Rfl:  .  Continuous Blood Gluc Sensor (FREESTYLE LIBRE 14 DAY SENSOR) MISC, FreeStyle Libre 14 Day Sensor kit, Disp: , Rfl:  .  Continuous Blood Gluc Sensor (FREESTYLE LIBRE SENSOR SYSTEM) MISC, Inject into the skin., Disp: , Rfl:  .  diazepam (VALIUM) 5 MG tablet, diazepam 5 mg tablet, Disp: , Rfl:  .  Difluprednate (DUREZOL) 0.05 % EMUL, INT 1 GTT INTO THE LEFT EYE BID PRF DISCOMFORT, Disp: , Rfl:  .   FLUVIRIN SUSP, ADM 0.5ML IM UTD, Disp: , Rfl: 0 .  furosemide (LASIX) 40 MG tablet, TK 1 T PO  TWICE A WEEK PRN FOR FLUID RETENTION, Disp: , Rfl: 2 .  glipiZIDE (GLUCOTROL XL) 5 MG 24 hr tablet, Take 5 mg by mouth daily with breakfast., Disp: , Rfl:  .  glucose blood test strip, Accu-Chek SmartView Test Strips, Disp: , Rfl:  .  glucose blood test strip, USE TO TEST BLOOD SUGAR THREE TIMES DAILY, Disp: , Rfl:  .  glucose blood test strip, USE TO TEST BLOOD SUGAR THREE TIMES DAILY, Disp: , Rfl:  .  insulin regular human CONCENTRATED (HUMULIN R) 500 UNIT/ML injection, , Disp: , Rfl:  .  Insulin Syringe-Needle U-100 (INSULIN SYRINGE .5CC/31GX5/16") 31G X 5/16" 0.5 ML MISC, , Disp: , Rfl:  .  Insulin Syringe-Needle U-100 (INSULIN SYRINGE 1CC/31GX5/16") 31G X 5/16" 1 ML MISC, , Disp: , Rfl:  .  lactulose (CHRONULAC) 10 GM/15ML solution, TK 30 ML PO Q 6 H PRN, Disp: , Rfl:  .  linaclotide (LINZESS) 145 MCG CAPS capsule, , Disp: , Rfl:  .  linaclotide (LINZESS) 145 MCG CAPS capsule, Take by mouth., Disp: , Rfl:  .  lisinopril-hydrochlorothiazide (PRINZIDE,ZESTORETIC) 10-12.5 MG tablet, , Disp: , Rfl:  .  LORazepam (ATIVAN) 1 MG tablet, lorazepam 1  mg tablet, Disp: , Rfl:  .  metFORMIN (GLUCOPHAGE-XR) 500 MG 24 hr tablet, Take 500 mg by mouth daily with breakfast., Disp: , Rfl:  .  methylPREDNISolone (MEDROL DOSEPAK) 4 MG TBPK tablet, , Disp: , Rfl:  .  metoCLOPramide (REGLAN) 5 MG tablet, Take 5 mg by mouth., Disp: , Rfl:  .  metoprolol succinate (TOPROL XL) 25 MG 24 hr tablet, Take 25 mg by mouth., Disp: , Rfl:  .  neomycin-polymyxin b-dexamethasone (MAXITROL) 3.5-10000-0.1 OINT, APPLY A SMALL AMOUNT ONTO SUTURES OR OPERATIVE SITE IN AFFECTED EYE BID FOR 3 DAYS THEN STOP, Disp: , Rfl: 0 .  NONFORMULARY OR COMPOUNDED ITEM, [facility]  Onychomycosis Nail Lacquer -  Fluconazole 2%, Terbinafine 1% DMSO Apply to affected nail once daily Qty. 120 gm 3 refills, Disp: 1 each, Rfl: 0 .  NONFORMULARY OR  COMPOUNDED ITEM, [facility]  Onychomycosis Nail Lacquer -  Fluconazole 2%, Terbinafine 1% DMSO Apply to affected nail once daily Qty. 120 gm 3 refills, Disp: 1 each, Rfl: 2 .  omeprazole (PRILOSEC) 20 MG capsule, , Disp: , Rfl:  .  omeprazole (PRILOSEC) 20 MG capsule, , Disp: , Rfl: 11 .  omeprazole (PRILOSEC) 40 MG capsule, Take 40 mg by mouth daily., Disp: , Rfl:  .  oxyCODONE-acetaminophen (PERCOCET/ROXICET) 5-325 MG tablet, , Disp: , Rfl:  .  prednisoLONE acetate (PRED FORTE) 1 % ophthalmic suspension, , Disp: , Rfl:  .  tobramycin-dexamethasone (TOBRADEX) ophthalmic solution, TobraDex 0.3 %-0.1 % eye drops,suspension  INSTILL 1 DROP INTO left EYE(S) BY OPHTHALMIC ROUTE EVERY 6 HOURS for 7 days, Disp: , Rfl:  .  traMADol (ULTRAM) 50 MG tablet, , Disp: , Rfl:  .  VICTOZA 18 MG/3ML SOPN, , Disp: , Rfl:  .  Vitamin D, Ergocalciferol, (DRISDOL) 50000 units CAPS capsule, , Disp: , Rfl:    Allergies  Allergen Reactions  . Iodinated Diagnostic Agents Itching and Swelling    Burning, swelling burns     Objective:  Physical Examination:  Vascular Examination: Capillary refill time immediate x 10 digits.  Palpable DP/PT pulses b/l.  Digital hair absent b/l.  Trace edema noted b/l.  Skin temperature gradient WNL b/l.  Dermatological Examination: Skin with normal turgor, texture and tone b/l.  No open wounds b/l.  No interdigital macerations noted b/l.  Elongated, thick, discolored brittle toenails with subungual debris and pain on dorsal palpation of nailbeds 1-5 b/l.  Musculoskeletal Examination: Muscle strength 5/5 to all muscle groups b/l  No pain, crepitus or joint discomfort with active/passive ROM.  Neurological Examination: Sensation diminished b/l with 10 gram monofilament.  Vibratory sensation absent b/l.  Assessment: Mycotic nail infection with pain 1-5 b/l NIDDM with neuropathy  Plan: 1. Toenails 1-5 b/l were debrided in length and girth without  iatrogenic laceration. 2.  Continue soft, supportive shoe gear daily. 3.  Report any pedal injuries to medical professional. 4.  Follow up 3 months. 5.  Patient/POA to call should there be a question/concern in there interim.

## 2019-06-27 ENCOUNTER — Other Ambulatory Visit: Payer: Self-pay | Admitting: Internal Medicine

## 2019-06-27 DIAGNOSIS — Z1231 Encounter for screening mammogram for malignant neoplasm of breast: Secondary | ICD-10-CM

## 2019-06-27 DIAGNOSIS — Z13820 Encounter for screening for osteoporosis: Secondary | ICD-10-CM

## 2019-09-20 ENCOUNTER — Other Ambulatory Visit: Payer: Self-pay

## 2019-09-20 ENCOUNTER — Ambulatory Visit
Admission: RE | Admit: 2019-09-20 | Discharge: 2019-09-20 | Disposition: A | Discharge status: Home / Self Care | Payer: Medicare HMO | Source: Ambulatory Visit | Attending: Internal Medicine | Admitting: Internal Medicine

## 2019-09-20 DIAGNOSIS — Z13820 Encounter for screening for osteoporosis: Secondary | ICD-10-CM

## 2019-09-20 DIAGNOSIS — Z1231 Encounter for screening mammogram for malignant neoplasm of breast: Secondary | ICD-10-CM

## 2019-10-23 ENCOUNTER — Ambulatory Visit: Payer: Medicare HMO | Admitting: Podiatry

## 2020-01-03 ENCOUNTER — Ambulatory Visit (INDEPENDENT_AMBULATORY_CARE_PROVIDER_SITE_OTHER): Payer: Medicare HMO | Admitting: Podiatry

## 2020-01-03 ENCOUNTER — Other Ambulatory Visit: Payer: Self-pay

## 2020-01-03 ENCOUNTER — Encounter: Payer: Self-pay | Admitting: Podiatry

## 2020-01-03 DIAGNOSIS — B351 Tinea unguium: Secondary | ICD-10-CM | POA: Diagnosis not present

## 2020-01-03 DIAGNOSIS — E1142 Type 2 diabetes mellitus with diabetic polyneuropathy: Secondary | ICD-10-CM | POA: Diagnosis not present

## 2020-01-03 DIAGNOSIS — B353 Tinea pedis: Secondary | ICD-10-CM | POA: Diagnosis not present

## 2020-01-03 DIAGNOSIS — M79676 Pain in unspecified toe(s): Secondary | ICD-10-CM

## 2020-01-03 MED ORDER — CLOTRIMAZOLE-BETAMETHASONE 1-0.05 % EX CREA: TOPICAL_CREAM | CUTANEOUS | 1 refills | Status: DC

## 2020-01-03 NOTE — Progress Notes (Signed)
Subjective: Charlotte Stuart is a 69 y.o. female patient seen today preventative diabetic foot care and painful mycotic nails b/l that are difficult to trim. Pain interferes with ambulation. Aggravating factors include wearing enclosed shoe gear. Pain is relieved with periodic professional debridement  Patient Active Problem List   Diagnosis Date Noted  . Daytime somnolence 08/10/2017  . Heart palpitations 04/19/2017  . Retinal detachment, right 04/09/2017  . DOE (dyspnea on exertion) 04/08/2017  . Ear pain, bilateral 12/25/2016  . Sensorineural hearing loss (SNHL) of right ear with unrestricted hearing of left ear 12/25/2016  . Pulsatile tinnitus of right ear 12/22/2016  . Heart murmur 10/22/2015  . Neck pain 09/22/2015  . Hypercalcemia 09/22/2015  . Hyperlipidemia 09/22/2015  . Insulin long-term use ([place]) 09/22/2015  . Vitamin D deficiency 09/22/2015  . 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 Allergic rhinitis 12/27/2013  . Constipation 12/27/2013  . Edema 12/27/2013  . GERD (gastroesophageal reflux disease) 12/27/2013  . Hyperlipemia 12/27/2013  . Type 2 diabetes mellitus without complication, with long-term current use of insulin ([place]) 12/27/2013  . DIABETES MELLITUS, TYPE II 07/14/2010  . CORONARY ATHEROSCLEROSIS NATIVE CORONARY ARTERY 05/14/2010  . REDNESS OR DISCHARGE OF EYE 08/02/2007  . CONTACT DERMATITIS 08/02/2007  . OBESITY 06/14/2007  . Essential hypertension 06/14/2007  . GASTROESOPHAGEAL REFLUX DISEASE, HX OF 06/14/2007    Current Outpatient Medications on File Prior to Visit  Medication Sig Dispense Refill  . ACCU-CHEK SMARTVIEW test strip USE TO TEST BLOOD SUGAR TID  0  . albuterol (PROAIR HFA) 108 (90 Base) MCG/ACT inhaler Inhale into the lungs.    Marland Kitchen amoxicillin-clavulanate (AUGMENTIN) 875-125 MG tablet TK 1 T PO BID FOR 10 DAYS  0  . aspirin EC 81 MG tablet Take 81 mg by mouth daily.    Marland Kitchen atorvastatin (LIPITOR) 20 MG tablet      . atorvastatin (LIPITOR) 20 MG tablet TK 1 T PO HS  3  . atropine 1 % ophthalmic solution     . BD PEN NEEDLE NANO U/F 32G X 4 MM MISC U UTD WITH VICTOZA  5  . brimonidine (ALPHAGAN) 0.2 % ophthalmic solution     . bumetanide (BUMEX) 2 MG tablet     . Cholecalciferol (VITAMIN D) 50 MCG (2000 UT) tablet Take by mouth.    . ciprofloxacin (CILOXAN) 0.3 % ophthalmic solution     . Continuous Blood Gluc Receiver (FREESTYLE LIBRE READER) DEVI Scan as needed up to 3 times per day as directed. Dx E11.65    . Continuous Blood Gluc Sensor (FREESTYLE LIBRE 14 DAY SENSOR) MISC FreeStyle Libre 14 Day Sensor kit    . Continuous Blood Gluc Sensor ([place]) MISC Inject into the skin.    . diazepam (VALIUM) 5 MG tablet diazepam 5 mg tablet    . Difluprednate (DUREZOL) 0.05 % EMUL INT 1 GTT INTO THE LEFT EYE BID PRF DISCOMFORT    . FLUVIRIN SUSP ADM 0.5ML IM UTD  0  . furosemide (LASIX) 40 MG tablet TK 1 T PO  TWICE A WEEK PRN FOR FLUID RETENTION  2  . glipiZIDE (GLUCOTROL XL) 5 MG 24 hr tablet Take 5 mg by mouth daily with breakfast.    . glucose blood test strip Accu-Chek SmartView Test Strips    . glucose blood test strip USE TO TEST BLOOD SUGAR THREE TIMES DAILY    . glucose blood test strip USE TO TEST BLOOD SUGAR THREE TIMES DAILY    .  insulin regular human CONCENTRATED (HUMULIN R) 500 UNIT/ML injection     . Insulin Syringe-Needle U-100 (INSULIN SYRINGE .5CC/31GX5/16") 31G X 5/16" 0.5 ML MISC     . Insulin Syringe-Needle U-100 (INSULIN SYRINGE 1CC/31GX5/16") 31G X 5/16" 1 ML MISC     . lactulose (CHRONULAC) 10 GM/15ML solution TK 30 ML PO Q 6 H PRN    . linaclotide (LINZESS) 145 MCG CAPS capsule     . linaclotide (LINZESS) 145 MCG CAPS capsule Take by mouth.    Marland Kitchen lisinopril-hydrochlorothiazide (PRINZIDE,ZESTORETIC) 10-12.5 MG tablet     . LORazepam (ATIVAN) 1 MG tablet lorazepam 1 mg tablet    . metFORMIN (GLUCOPHAGE-XR) 500 MG 24 hr tablet Take 500 mg by mouth daily with  breakfast.    . methylPREDNISolone (MEDROL DOSEPAK) 4 MG TBPK tablet     . metoCLOPramide (REGLAN) 5 MG tablet Take 5 mg by mouth.    . metoprolol succinate (TOPROL XL) 25 MG 24 hr tablet Take 25 mg by mouth.    . neomycin-polymyxin b-dexamethasone (MAXITROL) 3.5-10000-0.1 OINT APPLY A SMALL AMOUNT ONTO SUTURES OR OPERATIVE SITE IN AFFECTED EYE BID FOR 3 DAYS THEN STOP  0  . NONFORMULARY OR COMPOUNDED ITEM [facility]  Onychomycosis Nail Lacquer -  Fluconazole 2%, Terbinafine 1% DMSO Apply to affected nail once daily Qty. 120 gm 3 refills 1 each 0  . NONFORMULARY OR COMPOUNDED ITEM [facility]  Onychomycosis Nail Lacquer -  Fluconazole 2%, Terbinafine 1% DMSO Apply to affected nail once daily Qty. 120 gm 3 refills 1 each 2  . omeprazole (PRILOSEC) 20 MG capsule     . omeprazole (PRILOSEC) 20 MG capsule   11  . omeprazole (PRILOSEC) 40 MG capsule Take 40 mg by mouth daily.    Marland Kitchen oxyCODONE-acetaminophen (PERCOCET/ROXICET) 5-325 MG tablet     . prednisoLONE acetate (PRED FORTE) 1 % ophthalmic suspension     . tobramycin-dexamethasone (TOBRADEX) ophthalmic solution TobraDex 0.3 %-0.1 % eye drops,suspension  INSTILL 1 DROP INTO left EYE(S) BY OPHTHALMIC ROUTE EVERY 6 HOURS for 7 days    . traMADol (ULTRAM) 50 MG tablet     . VICTOZA 18 MG/3ML SOPN     . Vitamin D, Ergocalciferol, (DRISDOL) 50000 units CAPS capsule      No current facility-administered medications on file prior to visit.    Allergies  Allergen Reactions  . Iodinated Diagnostic Agents Itching and Swelling    Burning, swelling burns    Objective: Physical Exam  General: Charlotte Stuart is a pleasant 69 y.o. y.o. AA female, in NAD. AAO x 3.   Vascular:  Neurovascular status unchanged b/l. Capillary refill time to digits immediate b/l. Palpable DP pulses b/l. Palpable PT pulses b/l. Pedal hair present b/l. Skin temperature gradient within normal limits b/l.  Dermatological:  Pedal skin with normal  turgor, texture and tone bilaterally. No open wounds bilaterally. No interdigital macerations bilaterally. Toenails 1-5 b/l elongated, dystrophic, thickened, crumbly with subungual debris and tenderness to dorsal palpation. Diffuse scaling noted peripherally and plantarly b/l feet with mild foot odor.  No interdigital macerations.  No blisters, no weeping. No signs of secondary bacterial infection noted.  Musculoskeletal:  Normal muscle strength 5/5 to all lower extremity muscle groups bilaterally. No gross bony deformities bilaterally. No pain crepitus or joint limitation noted with ROM b/l. Patient ambulates independent of any assistive aids.  Neurological:  Protective sensation diminished with 10g monofilament b/l. Vibratory sensation diminished b/l.  Assessment and Plan:  1. Pain due to  onychomycosis of toenail   2. Tinea pedis of both feet   3. Diabetic peripheral neuropathy associated with type 2 diabetes mellitus ([place])    -Examined patient. -Continue diabetic foot care principles daily.  -Toenails 1-5 b/l were debrided in length and girth with sterile nail nippers and dremel without iatrogenic bleeding.  -Patient to continue soft, supportive shoe gear daily. -Patient to report any pedal injuries to medical professional immediately. -Discussed tinea pedis with patient. A prescription written for Lotrisone Cream to be applied to both feet twice daily for 4 weeks.  -Patient/POA to call should there be question/concern in the interim.  Return in about 3 months (around 04/04/2020) for nail trim.  Charlotte Stuart, DPM

## 2020-01-03 NOTE — Patient Instructions (Signed)
Diabetes Mellitus and Foot Care Foot care is an important part of your health, especially when you have diabetes. Diabetes may cause you to have problems because of poor blood flow (circulation) to your feet and legs, which can cause your skin to:  Become thinner and drier.  Break more easily.  Heal more slowly.  Peel and crack. You may also have nerve damage (neuropathy) in your legs and feet, causing decreased feeling in them. This means that you may not notice minor injuries to your feet that could lead to more serious problems. Noticing and addressing any potential problems early is the best way to prevent future foot problems. How to care for your feet Foot hygiene  Wash your feet daily with warm water and mild soap. Do not use hot water. Then, pat your feet and the areas between your toes until they are completely dry. Do not soak your feet as this can dry your skin.  Trim your toenails straight across. Do not dig under them or around the cuticle. File the edges of your nails with an emery board or nail file.  Apply a moisturizing lotion or petroleum jelly to the skin on your feet and to dry, brittle toenails. Use lotion that does not contain alcohol and is unscented. Do not apply lotion between your toes. Shoes and socks  Wear clean socks or stockings every day. Make sure they are not too tight. Do not wear knee-high stockings since they may decrease blood flow to your legs.  Wear shoes that fit properly and have enough cushioning. Always look in your shoes before you put them on to be sure there are no objects inside.  To break in new shoes, wear them for just a few hours a day. This prevents injuries on your feet. Wounds, scrapes, corns, and calluses  Check your feet daily for blisters, cuts, bruises, sores, and redness. If you cannot see the bottom of your feet, use a mirror or ask someone for help.  Do not cut corns or calluses or try to remove them with medicine.  If you  find a minor scrape, cut, or break in the skin on your feet, keep it and the skin around it clean and dry. You may clean these areas with mild soap and water. Do not clean the area with peroxide, alcohol, or iodine.  If you have a wound, scrape, corn, or callus on your foot, look at it several times a day to make sure it is healing and not infected. Check for: ? Redness, swelling, or pain. ? Fluid or blood. ? Warmth. ? Pus or a bad smell. General instructions  Do not cross your legs. This may decrease blood flow to your feet.  Do not use heating pads or hot water bottles on your feet. They may burn your skin. If you have lost feeling in your feet or legs, you may not know this is happening until it is too late.  Protect your feet from hot and cold by wearing shoes, such as at the beach or on hot pavement.  Schedule a complete foot exam at least once a year (annually) or more often if you have foot problems. If you have foot problems, report any cuts, sores, or bruises to your health care provider immediately. Contact a health care provider if:  You have a medical condition that increases your risk of infection and you have any cuts, sores, or bruises on your feet.  You have an injury that is not   healing.  You have redness on your legs or feet.  You feel burning or tingling in your legs or feet.  You have pain or cramps in your legs and feet.  Your legs or feet are numb.  Your feet always feel cold.  You have pain around a toenail. Get help right away if:  You have a wound, scrape, corn, or callus on your foot and: ? You have pain, swelling, or redness that gets worse. ? You have fluid or blood coming from the wound, scrape, corn, or callus. ? Your wound, scrape, corn, or callus feels warm to the touch. ? You have pus or a bad smell coming from the wound, scrape, corn, or callus. ? You have a fever. ? You have a red line going up your leg. Summary  Check your feet every day  for cuts, sores, red spots, swelling, and blisters.  Moisturize feet and legs daily.  Wear shoes that fit properly and have enough cushioning.  If you have foot problems, report any cuts, sores, or bruises to your health care provider immediately.  Schedule a complete foot exam at least once a year (annually) or more often if you have foot problems. This information is not intended to replace advice given to you by your health care provider. Make sure you discuss any questions you have with your health care provider. Document Revised: 05/03/2019 Document Reviewed: 09/11/2016 Elsevier Patient Education  2020 [employer]  

## 2020-03-12 ENCOUNTER — Other Ambulatory Visit (HOSPITAL_COMMUNITY): Payer: Self-pay | Admitting: Nurse Practitioner

## 2020-03-12 ENCOUNTER — Other Ambulatory Visit: Payer: Self-pay | Admitting: Nurse Practitioner

## 2020-03-12 DIAGNOSIS — R0602 Shortness of breath: Secondary | ICD-10-CM

## 2020-03-12 DIAGNOSIS — M79669 Pain in unspecified lower leg: Secondary | ICD-10-CM

## 2020-03-12 DIAGNOSIS — R071 Chest pain on breathing: Secondary | ICD-10-CM

## 2020-03-13 ENCOUNTER — Other Ambulatory Visit: Payer: Self-pay | Admitting: Nurse Practitioner

## 2020-03-13 DIAGNOSIS — N644 Mastodynia: Secondary | ICD-10-CM

## 2020-03-14 ENCOUNTER — Ambulatory Visit (HOSPITAL_COMMUNITY)
Admission: RE | Admit: 2020-03-14 | Discharge: 2020-03-14 | Disposition: A | Discharge status: Home / Self Care | Payer: Medicare HMO | Source: Ambulatory Visit | Attending: Nurse Practitioner | Admitting: Nurse Practitioner

## 2020-03-14 ENCOUNTER — Ambulatory Visit (HOSPITAL_BASED_OUTPATIENT_CLINIC_OR_DEPARTMENT_OTHER)
Admission: RE | Admit: 2020-03-14 | Discharge: 2020-03-14 | Disposition: A | Discharge status: Home / Self Care | Payer: Medicare HMO | Source: Ambulatory Visit | Attending: Nurse Practitioner | Admitting: Nurse Practitioner

## 2020-03-14 ENCOUNTER — Other Ambulatory Visit: Payer: Self-pay

## 2020-03-14 ENCOUNTER — Other Ambulatory Visit (HOSPITAL_COMMUNITY): Payer: Self-pay | Admitting: Nurse Practitioner

## 2020-03-14 DIAGNOSIS — R0602 Shortness of breath: Secondary | ICD-10-CM

## 2020-03-14 DIAGNOSIS — M79669 Pain in unspecified lower leg: Secondary | ICD-10-CM | POA: Insufficient documentation

## 2020-03-14 DIAGNOSIS — R071 Chest pain on breathing: Secondary | ICD-10-CM | POA: Insufficient documentation

## 2020-03-14 DIAGNOSIS — M7989 Other specified soft tissue disorders: Secondary | ICD-10-CM | POA: Diagnosis not present

## 2020-03-14 MED ORDER — TECHNETIUM TO 99M ALBUMIN AGGREGATED
3.8000 | Freq: Once | INTRAVENOUS | Status: AC | PRN
  Administered 2020-03-14: 3.8 via INTRAVENOUS

## 2020-03-14 NOTE — Progress Notes (Signed)
Lower extremity venous has been completed.   Preliminary results in CV Proc.   Blanch Media 03/14/2020 11:20 AM

## 2020-04-02 ENCOUNTER — Other Ambulatory Visit: Payer: Medicare HMO

## 2020-04-16 ENCOUNTER — Ambulatory Visit
Admission: RE | Admit: 2020-04-16 | Discharge: 2020-04-16 | Disposition: A | Discharge status: Home / Self Care | Payer: Medicare HMO | Source: Ambulatory Visit | Attending: Nurse Practitioner | Admitting: Nurse Practitioner

## 2020-04-16 ENCOUNTER — Other Ambulatory Visit: Payer: Self-pay

## 2020-04-16 DIAGNOSIS — N644 Mastodynia: Secondary | ICD-10-CM

## 2020-04-23 DIAGNOSIS — M545 Low back pain, unspecified: Secondary | ICD-10-CM | POA: Insufficient documentation

## 2020-05-31 ENCOUNTER — Ambulatory Visit: Payer: Medicare HMO | Admitting: Podiatry

## 2020-08-05 ENCOUNTER — Ambulatory Visit (INDEPENDENT_AMBULATORY_CARE_PROVIDER_SITE_OTHER): Payer: Medicare HMO | Admitting: Podiatry

## 2020-08-05 ENCOUNTER — Encounter: Payer: Self-pay | Admitting: Podiatry

## 2020-08-05 ENCOUNTER — Other Ambulatory Visit: Payer: Self-pay

## 2020-08-05 DIAGNOSIS — B351 Tinea unguium: Secondary | ICD-10-CM | POA: Diagnosis not present

## 2020-08-05 DIAGNOSIS — Z1211 Encounter for screening for malignant neoplasm of colon: Secondary | ICD-10-CM | POA: Insufficient documentation

## 2020-08-05 DIAGNOSIS — R1032 Left lower quadrant pain: Secondary | ICD-10-CM | POA: Insufficient documentation

## 2020-08-05 DIAGNOSIS — E119 Type 2 diabetes mellitus without complications: Secondary | ICD-10-CM | POA: Diagnosis not present

## 2020-08-05 DIAGNOSIS — K625 Hemorrhage of anus and rectum: Secondary | ICD-10-CM | POA: Insufficient documentation

## 2020-08-05 DIAGNOSIS — M940 Chondrocostal junction syndrome [Tietze]: Secondary | ICD-10-CM | POA: Insufficient documentation

## 2020-08-05 DIAGNOSIS — R143 Flatulence: Secondary | ICD-10-CM | POA: Insufficient documentation

## 2020-08-05 DIAGNOSIS — E1142 Type 2 diabetes mellitus with diabetic polyneuropathy: Secondary | ICD-10-CM | POA: Diagnosis not present

## 2020-08-05 DIAGNOSIS — R079 Chest pain, unspecified: Secondary | ICD-10-CM | POA: Insufficient documentation

## 2020-08-05 DIAGNOSIS — Z800 Family history of malignant neoplasm of digestive organs: Secondary | ICD-10-CM | POA: Insufficient documentation

## 2020-08-05 DIAGNOSIS — M79676 Pain in unspecified toe(s): Secondary | ICD-10-CM | POA: Diagnosis not present

## 2020-08-05 DIAGNOSIS — K449 Diaphragmatic hernia without obstruction or gangrene: Secondary | ICD-10-CM | POA: Insufficient documentation

## 2020-08-10 NOTE — Progress Notes (Signed)
ANNUAL DIABETIC FOOT EXAM  Subjective: Charlotte Stuart presents today for for annual diabetic foot examination.  Patient relates 30 year h/o diabetes.  Patient relates no h/o foot wounds.  Patient relates no symptoms of foot numbness.  Patient relates no symptoms of foot tingling.  Patient relates no symptoms of burning in feet.  Charlotte Schaumann., MD is patient's PCP. Last visit was 06/27/2020.  Past Medical History:  Diagnosis Date   CAD (coronary artery disease)    Diabetes mellitus without complication (HCC)    GERD (gastroesophageal reflux disease)    Heart murmur    Hypercalcemia    Hyperlipidemia    Hypertension    Retinal detachment    right   Sensorineural hearing loss (SNHL) of right ear with unrestricted hearing of left ear    Vitamin D deficiency    Patient Active Problem List   Diagnosis Date Noted   Chest pain 08/05/2020   Colon cancer screening 08/05/2020   Costochondritis 08/05/2020   Flatulence, eructation and gas pain 08/05/2020   Family history of malignant neoplasm of gastrointestinal tract 08/05/2020   Hiatal hernia 08/05/2020   Left lower quadrant pain 08/05/2020   Rectal bleeding 08/05/2020   Acute bilateral low back pain without sciatica 04/23/2020   Daytime somnolence 08/10/2017   Heart palpitations 04/19/2017   Retinal detachment, right 04/09/2017   DOE (dyspnea on exertion) 04/08/2017   Ear pain, bilateral 12/25/2016   Sensorineural hearing loss (SNHL) of right ear with unrestricted hearing of left ear 12/25/2016   Pulsatile tinnitus of right ear 12/22/2016   Heart murmur 10/22/2015   Neck pain 09/22/2015   Hypercalcemia 09/22/2015   Hyperlipidemia 09/22/2015   Insulin long-term use (HCC) 09/22/2015   Vitamin D deficiency 09/22/2015   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 06/26/2015   Allergic rhinitis 12/27/2013   Constipation 12/27/2013   Edema 12/27/2013   GERD  (gastroesophageal reflux disease) 12/27/2013   Hyperlipemia 12/27/2013   Type 2 diabetes mellitus without complication, with long-term current use of insulin (HCC) 12/27/2013   DIABETES MELLITUS, TYPE II 07/14/2010   CORONARY ATHEROSCLEROSIS NATIVE CORONARY ARTERY 05/14/2010   REDNESS OR DISCHARGE OF EYE 08/02/2007   CONTACT DERMATITIS 08/02/2007   OBESITY 06/14/2007   Essential hypertension 06/14/2007   GASTROESOPHAGEAL REFLUX DISEASE, HX OF 06/14/2007    Allergies  Allergen Reactions   Iodinated Diagnostic Agents Itching and Swelling    Burning, swelling burns   Social History   Occupational History   Not on file  Tobacco Use   Smoking status: Never Smoker   Smokeless tobacco: Current User    Types: Snuff  Substance and Sexual Activity   Alcohol use: Not on file   Drug use: Not on file   Sexual activity: Not on file   History reviewed. No pertinent family history. Immunization History  Administered Date(s) Administered   Influenza Whole 07/14/2010     Review of Systems: Negative except as noted in the HPI.  Objective: There were no vitals filed for this visit.  Charlotte Stuart is a pleasant 69 y.o. female in NAD. AAO X 3.  Vascular Examination: Capillary refill time to digits immediate b/l. Palpable pedal pulses b/l LE. Pedal hair present. Lower extremity skin temperature gradient within normal limits. Trace edema noted b/l lower extremities.  Dermatological Examination: Pedal skin with normal turgor, texture and tone bilaterally. No open wounds bilaterally. No interdigital macerations bilaterally. Toenails 1-5 b/l elongated, discolored, dystrophic, thickened, crumbly with subungual debris and tenderness  to dorsal palpation.  Musculoskeletal Examination: Normal muscle strength 5/5 to all lower extremity muscle groups bilaterally. No pain crepitus or joint limitation noted with ROM b/l. No gross bony deformities bilaterally. Patient ambulates  independent of any assistive aids.  Footwear Assessment: Does the patient wear appropriate shoes? Yes . Does the patient need inserts/orthotics? No.  Neurological Examination: Protective sensation diminished with 10g monofilament b/l. Vibratory sensation diminished b/l.  Assessment: 1. Pain due to onychomycosis of toenail   2. Diabetic peripheral neuropathy associated with type 2 diabetes mellitus (HCC)   3. Encounter for diabetic foot exam (HCC)     ADA Risk Categorization: High Risk  Patient has one or more of the following: Loss of protective sensation Absent pedal pulses Severe Foot deformity History of foot ulcer  Plan: -Examined patient. -No new findings. No new orders. -Diabetic foot examination performed on today's visit. -Patient to continue soft, supportive shoe gear daily. -Toenails 1-5 b/l were debrided in length and girth with sterile nail nippers and dremel without iatrogenic bleeding.  -Patient to report any pedal injuries to medical professional immediately. -Patient/POA to call should there be question/concern in the interim.  No follow-ups on file.  Freddie Breech, DPM

## 2020-08-29 ENCOUNTER — Other Ambulatory Visit: Payer: Self-pay | Admitting: Internal Medicine

## 2020-08-29 DIAGNOSIS — Z1231 Encounter for screening mammogram for malignant neoplasm of breast: Secondary | ICD-10-CM

## 2020-10-09 ENCOUNTER — Ambulatory Visit
Admission: RE | Admit: 2020-10-09 | Discharge: 2020-10-09 | Disposition: A | Discharge status: Home / Self Care | Payer: Medicare HMO | Source: Ambulatory Visit | Attending: Internal Medicine | Admitting: Internal Medicine

## 2020-10-09 ENCOUNTER — Ambulatory Visit: Payer: Medicare HMO

## 2020-10-09 ENCOUNTER — Other Ambulatory Visit: Payer: Self-pay

## 2020-10-09 DIAGNOSIS — Z1231 Encounter for screening mammogram for malignant neoplasm of breast: Secondary | ICD-10-CM

## 2020-11-05 ENCOUNTER — Ambulatory Visit: Payer: Medicare HMO | Admitting: Podiatry

## 2021-02-14 ENCOUNTER — Encounter: Payer: Self-pay | Admitting: Podiatry

## 2021-02-14 ENCOUNTER — Ambulatory Visit (INDEPENDENT_AMBULATORY_CARE_PROVIDER_SITE_OTHER): Payer: Medicare HMO | Admitting: Podiatry

## 2021-02-14 ENCOUNTER — Other Ambulatory Visit: Payer: Self-pay

## 2021-02-14 DIAGNOSIS — M79676 Pain in unspecified toe(s): Secondary | ICD-10-CM | POA: Diagnosis not present

## 2021-02-14 DIAGNOSIS — E1142 Type 2 diabetes mellitus with diabetic polyneuropathy: Secondary | ICD-10-CM

## 2021-02-14 DIAGNOSIS — B351 Tinea unguium: Secondary | ICD-10-CM

## 2021-02-16 NOTE — Progress Notes (Signed)
  Subjective:  Patient ID: Charlotte Stuart, female    DOB: 07/22/1951,  MRN: [id number]  Charlotte Stuart presents to clinic today for preventative diabetic foot care and painful thick toenails that are difficult to trim. Pain interferes with ambulation. Aggravating factors include wearing enclosed shoe gear. Pain is relieved with periodic professional debridement.  Patient states blood glucose was 178 mg/dl this morning, but dropped down below 60 mg/dl after taking her medication. States she does not eat an evening snack.  PCP is Cheron Schaumann., MD , and last visit was 04/23/2020.  Allergies  Allergen Reactions   Iodinated Diagnostic Agents Itching and Swelling    Burning, swelling burns    Review of Systems: Negative except as noted in the HPI. Objective:   Constitutional Charlotte Stuart is a pleasant 70 y.o. African American female, in NAD. AAO x 3.   Vascular Capillary refill time to digits immediate b/l. Palpable DP pulse(s) b/l lower extremities Palpable PT pulse(s) b/l lower extremities Pedal hair present. Lower extremity skin temperature gradient within normal limits. Trace edema noted b/l lower extremities. No cyanosis or clubbing noted.  Neurologic Normal speech. Oriented to person, place, and time. Protective sensation diminished with 10g monofilament b/l.  Dermatologic Pedal skin with normal turgor, texture and tone b/l lower extremities No open wounds b/l lower extremities No interdigital macerations b/l lower extremities Toenails 1-5 b/l elongated, discolored, dystrophic, thickened, crumbly with subungual debris and tenderness to dorsal palpation.  Orthopedic: Normal muscle strength 5/5 to all lower extremity muscle groups bilaterally. No pain crepitus or joint limitation noted with ROM b/l. No gross bony deformities bilaterally.   Radiographs: None Assessment:   1. Pain due to onychomycosis of toenail   2. Diabetic peripheral neuropathy associated with type 2  diabetes mellitus (HCC)    Plan:  Patient was evaluated and treated and all questions answered.  Onychomycosis with pain -Nails palliatively debridement as below -Educated on self-care  Procedure: Nail Debridement Rationale: Pain Type of Debridement: manual, sharp debridement. Instrumentation: Nail nipper, rotary burr. Number of Nails: 10 -Examined patient. -Continue diabetic foot care principles. -Patient to continue soft, supportive shoe gear daily. -Toenails 1-5 b/l were debrided in length and girth with sterile nail nippers and dremel without iatrogenic bleeding.  -Patient to report any pedal injuries to medical professional immediately. -Patient/POA to call should there be question/concern in the interim.  Return in about 3 months (around 05/17/2021).  Freddie Breech, DPM

## 2021-05-06 ENCOUNTER — Observation Stay (HOSPITAL_COMMUNITY)
Admission: EM | Admit: 2021-05-06 | Discharge: 2021-05-08 | Disposition: A | Discharge status: Home / Self Care | Payer: Medicare HMO | Source: Home / Self Care | Attending: Emergency Medicine | Admitting: Emergency Medicine

## 2021-05-06 ENCOUNTER — Emergency Department (HOSPITAL_COMMUNITY): Payer: Medicare HMO

## 2021-05-06 ENCOUNTER — Ambulatory Visit (INDEPENDENT_AMBULATORY_CARE_PROVIDER_SITE_OTHER): Payer: Medicare HMO | Admitting: Podiatry

## 2021-05-06 ENCOUNTER — Other Ambulatory Visit: Payer: Self-pay

## 2021-05-06 DIAGNOSIS — B351 Tinea unguium: Secondary | ICD-10-CM

## 2021-05-06 DIAGNOSIS — Z7982 Long term (current) use of aspirin: Secondary | ICD-10-CM | POA: Insufficient documentation

## 2021-05-06 DIAGNOSIS — Z79899 Other long term (current) drug therapy: Secondary | ICD-10-CM | POA: Insufficient documentation

## 2021-05-06 DIAGNOSIS — Z20822 Contact with and (suspected) exposure to covid-19: Secondary | ICD-10-CM | POA: Insufficient documentation

## 2021-05-06 DIAGNOSIS — I25110 Atherosclerotic heart disease of native coronary artery with unstable angina pectoris: Secondary | ICD-10-CM | POA: Insufficient documentation

## 2021-05-06 DIAGNOSIS — M79676 Pain in unspecified toe(s): Secondary | ICD-10-CM | POA: Diagnosis not present

## 2021-05-06 DIAGNOSIS — Z7984 Long term (current) use of oral hypoglycemic drugs: Secondary | ICD-10-CM | POA: Insufficient documentation

## 2021-05-06 DIAGNOSIS — E1142 Type 2 diabetes mellitus with diabetic polyneuropathy: Secondary | ICD-10-CM | POA: Diagnosis not present

## 2021-05-06 DIAGNOSIS — Z794 Long term (current) use of insulin: Secondary | ICD-10-CM | POA: Insufficient documentation

## 2021-05-06 DIAGNOSIS — I10 Essential (primary) hypertension: Secondary | ICD-10-CM | POA: Insufficient documentation

## 2021-05-06 DIAGNOSIS — Z23 Encounter for immunization: Secondary | ICD-10-CM | POA: Insufficient documentation

## 2021-05-06 DIAGNOSIS — R079 Chest pain, unspecified: Secondary | ICD-10-CM

## 2021-05-06 DIAGNOSIS — I200 Unstable angina: Secondary | ICD-10-CM | POA: Diagnosis present

## 2021-05-06 DIAGNOSIS — R072 Precordial pain: Secondary | ICD-10-CM

## 2021-05-06 DIAGNOSIS — E119 Type 2 diabetes mellitus without complications: Secondary | ICD-10-CM | POA: Insufficient documentation

## 2021-05-06 LAB — BASIC METABOLIC PANEL
Anion gap: 9 (ref 5–15)
BUN: 10 mg/dL (ref 8–23)
CO2: 26 mmol/L (ref 22–32)
Calcium: 9.3 mg/dL (ref 8.9–10.3)
Chloride: 108 mmol/L (ref 98–111)
Creatinine, Ser: 0.72 mg/dL (ref 0.44–1.00)
GFR, Estimated: >60 mL/min (ref >60)
Glucose, Bld: 122 mg/dL — ABNORMAL HIGH (ref 70–99)
Potassium: 3.2 mmol/L — ABNORMAL LOW (ref 3.5–5.1)
Sodium: 143 mmol/L (ref 135–145)

## 2021-05-06 LAB — CBC
HCT: 35.9 % — ABNORMAL LOW (ref 36.0–46.0)
Hemoglobin: 11.2 g/dL — ABNORMAL LOW (ref 12.0–15.0)
MCH: 28.3 pg (ref 26.0–34.0)
MCHC: 31.2 g/dL (ref 30.0–36.0)
MCV: 90.7 fL (ref 80.0–100.0)
Platelets: 243 10*3/uL (ref 150–400)
RBC: 3.96 MIL/uL (ref 3.87–5.11)
RDW: 14.1 % (ref 11.5–15.5)
WBC: 7.6 10*3/uL (ref 4.0–10.5)
nRBC: 0 % (ref 0.0–0.2)

## 2021-05-06 LAB — TROPONIN I (HIGH SENSITIVITY): Troponin I (High Sensitivity): 8 ng/L (ref <18)

## 2021-05-06 LAB — BRAIN NATRIURETIC PEPTIDE: B Natriuretic Peptide: 89 pg/mL (ref 0.0–100.0)

## 2021-05-06 NOTE — ED Triage Notes (Signed)
Pt brought in by EMS for chest pain and shortness of breath x 2 hours. Pt was given aspirin and nitro by EMS and states her pain improved. Pt describes the pain as a pressure.

## 2021-05-06 NOTE — ED Provider Notes (Signed)
[facility] EMERGENCY DEPARTMENT Provider Note   CSN: [id number] Arrival date & time: 05/06/21  1847     History Chief Complaint  Patient presents with  . Chest Pain  . Shortness of Breath    Charlotte Stuart is a 70 y.o. female.  HPI     Is a 70 year old female with history of coronary artery disease, diabetes, hypertension, hyperlipidemia who presents with chest pain.  Patient reports onset of symptoms this afternoon around 3:57 PM.  She states she was cleaning her house when she had onset of pressure-like chest pain.  She reports associated shortness of breath and diaphoresis.  She states she waited approximately 30 minutes and the pain worsened so she called EMS.  Pain was resolved with nitroglycerin in route.  She is currently pain-free.  Patient states that when she gets up and walks around pain seems to be worse.  She reports having a heart attack 20 to 25 years ago.  She has not had any recent ischemic evaluation and is not followed by cardiologist.  She does report frequent episodes of "gallbladder attacks" which she states she has self diagnosed.  She states she feels "gassy right now."  No nausea or vomiting.  No fevers.  Past Medical History:  Diagnosis Date  . CAD (coronary artery disease)   . Diabetes mellitus without complication ([place])   . GERD (gastroesophageal reflux disease)   . Heart murmur   . Hypercalcemia   . Hyperlipidemia   . Hypertension   . Retinal detachment    right  . Sensorineural hearing loss (SNHL) of right ear with unrestricted hearing of left ear   . Vitamin D deficiency     Patient Active Problem List   Diagnosis Date Noted  . Chest pain 08/05/2020  . Colon cancer screening 08/05/2020  . Costochondritis 08/05/2020  . Flatulence, eructation and gas pain 08/05/2020  . Family history of malignant neoplasm of gastrointestinal tract 08/05/2020  . Hiatal hernia 08/05/2020  . Left lower quadrant pain 08/05/2020  . Rectal  bleeding 08/05/2020  . Acute bilateral low back pain without sciatica 04/23/2020  . Daytime somnolence 08/10/2017  . Heart palpitations 04/19/2017  . Retinal detachment, right 04/09/2017  . DOE (dyspnea on exertion) 04/08/2017  . Ear pain, bilateral 12/25/2016  . Sensorineural hearing loss (SNHL) of right ear with unrestricted hearing of left ear 12/25/2016  . Pulsatile tinnitus of right ear 12/22/2016  . Heart murmur 10/22/2015  . Neck pain 09/22/2015  . Hypercalcemia 09/22/2015  . Hyperlipidemia 09/22/2015  . Insulin long-term use ([place]) 09/22/2015  . Vitamin D deficiency 09/22/2015  . 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 Allergic rhinitis 12/27/2013  . Constipation 12/27/2013  . Edema 12/27/2013  . GERD (gastroesophageal reflux disease) 12/27/2013  . Hyperlipemia 12/27/2013  . Type 2 diabetes mellitus without complication, with long-term current use of insulin ([place]) 12/27/2013  . DIABETES MELLITUS, TYPE II 07/14/2010  . CORONARY ATHEROSCLEROSIS NATIVE CORONARY ARTERY 05/14/2010  . REDNESS OR DISCHARGE OF EYE 08/02/2007  . CONTACT DERMATITIS 08/02/2007  . OBESITY 06/14/2007  . Essential hypertension 06/14/2007  . GASTROESOPHAGEAL REFLUX DISEASE, HX OF 06/14/2007    Past Surgical History:  Procedure Laterality Date  . BREAST BIOPSY       OB History   No obstetric history on file.     No family history on file.  Social History   Tobacco Use  . Smoking status: Never  . Smokeless tobacco: Current  Types: Snuff    Home Medications Prior to Admission medications   Medication Sig Start Date End Date Taking? Authorizing Provider  ACCU-CHEK SMARTVIEW test strip USE TO TEST BLOOD SUGAR TID 05/04/18   [provider]  albuterol (PROAIR HFA) 108 (90 Base) MCG/ACT inhaler Inhale into the lungs. 12/28/13   [provider]  amoxicillin-clavulanate (AUGMENTIN) 875-125 MG tablet TK 1 T PO BID FOR 10 DAYS 12/17/15   [provider]  aspirin EC 81 MG tablet Take 81 mg by mouth daily.    [provider]  atorvastatin (LIPITOR) 20 MG tablet  05/17/15   [provider]  atorvastatin (LIPITOR) 20 MG tablet TK 1 T PO HS 12/23/15   [provider]  atorvastatin (LIPITOR) 20 MG tablet Take 1 tablet by mouth daily. 04/20/17   [provider]  atropine 1 % ophthalmic solution  06/27/15   [provider]  BD PEN NEEDLE NANO U/F 32G X 4 MM MISC U UTD WITH VICTOZA 12/03/15   [provider]  brimonidine (ALPHAGAN) 0.2 % ophthalmic solution  08/02/15   [provider]  bumetanide (BUMEX) 2 MG tablet  07/01/15   [provider]  Cholecalciferol (VITAMIN D) 50 MCG (2000 UT) tablet Take by mouth. 10/29/17   [provider]  ciprofloxacin (CILOXAN) 0.3 % ophthalmic solution  06/17/15   [provider]  clotrimazole-betamethasone (LOTRISONE) cream Apply to both feet and between toes bid for 4 weeks. 01/03/20   Marzetta Board, DPM  Continuous Blood Gluc Receiver (FREESTYLE LIBRE READER) DEVI Scan as needed up to 3 times per day as directed. Dx E11.65 04/18/18   [provider]  Continuous Blood Gluc Sensor (FREESTYLE LIBRE 14 DAY SENSOR) MISC FreeStyle Libre 14 Day Sensor kit    [provider]  Continuous Blood Gluc Sensor ([place]) MISC Inject into the skin. 04/14/18   [provider]  diazepam (VALIUM) 5 MG tablet diazepam 5 mg tablet    [provider]  Difluprednate (DUREZOL) 0.05 % EMUL INT 1 GTT INTO THE LEFT EYE BID PRF DISCOMFORT 07/31/17   [provider]  doxycycline (VIBRAMYCIN) 100 MG capsule  03/08/20   [provider]  ergocalciferol (VITAMIN D2) 1.25 MG (50000 UT) capsule TAKE 1 CAPSULE BY MOUTH 1 TIME A WEEK. START OTC VITAMIN-D EVERY DAY 2000 UNITS DAILY AFTER COMPLETING PRESCRIPTION 01/10/21   [provider]  erythromycin ophthalmic ointment   05/03/20   [provider]  FLUVIRIN SUSP ADM 0.5ML IM UTD 10/10/15   [provider]  furosemide (LASIX) 40 MG tablet TK 1 T PO  TWICE A WEEK PRN FOR FLUID RETENTION 01/01/16   [provider]  gatifloxacin (ZYMAXID) 0.5 % SOLN Place 1 drop into the right eye 4 (four) times daily. 10/03/20   [provider]  glipiZIDE (GLUCOTROL XL) 5 MG 24 hr tablet Take 5 mg by mouth daily with breakfast.    [provider]  glucose blood (ACCU-CHEK SMARTVIEW) test strip 3 (three) times daily. 04/01/21   [provider]  glucose blood test strip Accu-Chek SmartView Test Strips    [provider]  glucose blood test strip USE TO TEST BLOOD SUGAR THREE TIMES DAILY 05/24/18   [provider]  glucose blood test strip USE TO TEST BLOOD SUGAR THREE TIMES DAILY 04/20/18   [provider]  glucose blood test strip Accu-Chek SmartView Test Strips  TEST THREE TIMES DAILY AS DIRECTED.  [provider]  HUMULIN R U-500 KWIKPEN 500 UNIT/ML Claiborne Rigg  07/25/20   [provider]  hydroquinone 4 % cream hydroquinone 4 % topical cream  APPLY TO THE AFFECTED AREA(S) BY TOPICAL ROUTE 2 TIMES PER DAY IN THE MORNING AND AT BEDTIME FOR 4 WEEKS    [provider]  Insulin Syringe-Needle U-100 (INSULIN SYRINGE .5CC/31GX5/16") 31G X 5/16" 0.5 ML MISC  05/10/15   [provider]  Insulin Syringe-Needle U-100 (INSULIN SYRINGE 1CC/31GX5/16") 31G X 5/16" 1 ML MISC  02/15/14   [provider]  lactulose (CHRONULAC) 10 GM/15ML solution TK 30 ML PO Q 6 H PRN 12/19/18   [provider]  linaclotide (LINZESS) 145 MCG CAPS capsule Take by mouth. 01/02/21   [provider]  liraglutide (VICTOZA) 18 MG/3ML SOPN     [provider]  lisinopril-hydrochlorothiazide (ZESTORETIC) 10-12.5 MG tablet Take 1 tablet by mouth daily. 11/29/20   [provider]  LORazepam (ATIVAN) 1 MG tablet lorazepam 1 mg tablet     [provider]  metFORMIN (GLUCOPHAGE-XR) 500 MG 24 hr tablet Take 500 mg by mouth daily with breakfast.    [provider]  methylPREDNISolone (MEDROL DOSEPAK) 4 MG TBPK tablet  06/27/15   [provider]  metoCLOPramide (REGLAN) 5 MG tablet Take 5 mg by mouth. 12/27/13   [provider]  metoprolol succinate (TOPROL XL) 25 MG 24 hr tablet Take 25 mg by mouth. 12/28/13   [provider]  Metoprolol-hydroCHLOROthiazide (DUTOPROL PO) metoprolol su-hydrochlorothiaz    [provider]  neomycin-polymyxin b-dexamethasone (MAXITROL) 3.5-10000-0.1 OINT APPLY A SMALL AMOUNT ONTO SUTURES OR OPERATIVE SITE IN AFFECTED EYE BID FOR 3 DAYS THEN STOP 07/07/18   [provider]  NONFORMULARY OR COMPOUNDED ITEM [facility]  Onychomycosis Nail Lacquer -  Fluconazole 2%, Terbinafine 1% DMSO Apply to affected nail once daily Qty. 120 gm 3 refills 04/19/17   Edrick Kins, DPM  NONFORMULARY OR COMPOUNDED ITEM [facility]  Onychomycosis Nail Lacquer -  Fluconazole 2%, Terbinafine 1% DMSO Apply to affected nail once daily Qty. 120 gm 3 refills 10/27/17   Edrick Kins, DPM  omeprazole (PRILOSEC) 20 MG capsule  06/19/15   [provider]  omeprazole (PRILOSEC) 20 MG capsule  12/23/15   [provider]  omeprazole (PRILOSEC) 40 MG capsule Take 40 mg by mouth daily.    [provider]  oxyCODONE-acetaminophen (PERCOCET/ROXICET) 5-325 MG tablet  06/27/15   [provider]  prednisoLONE acetate (PRED FORTE) 1 % ophthalmic suspension  07/25/15   [provider]  PROLENSA 0.07 % SOLN Place 1 drop into the right eye at bedtime. 10/03/20   [provider]  rosuvastatin (CRESTOR) 10 MG tablet Pt is to take 1 at bedtime. 07/01/20   [provider]  tobramycin-dexamethasone (TOBRADEX) ophthalmic solution TobraDex 0.3 %-0.1 % eye drops,suspension  INSTILL 1 DROP INTO left EYE(S) BY OPHTHALMIC  ROUTE EVERY 6 HOURS for 7 days    [provider]  traMADol Veatrice Bourbon) 50 MG tablet  06/26/15   [provider]    Allergies    Iodinated diagnostic agents  Review of Systems   Review of Systems  Constitutional:  Positive for diaphoresis. Negative for fever.  Respiratory:  Positive for chest tightness and shortness of breath.   Cardiovascular:  Positive for chest pain. Negative for leg swelling.  Gastrointestinal:  Negative for abdominal pain, nausea and vomiting.  Genitourinary:  Negative for dysuria.  All other systems reviewed  and are negative.  Physical Exam Updated Vital Signs BP (!) 113/51   Pulse 71   Temp 99 F (37.2 C) (Oral)   Resp 15   Ht 1.549 m (5' 1" )   Wt 108.9 kg   SpO2 97%   BMI 45.35 kg/m   Physical Exam Vitals and nursing note reviewed.  Constitutional:      Appearance: She is well-developed. She is obese. She is not ill-appearing.  HENT:     Head: Normocephalic and atraumatic.  Eyes:     Pupils: Pupils are equal, round, and reactive to light.  Cardiovascular:     Rate and Rhythm: Normal rate and regular rhythm.     Heart sounds: Normal heart sounds.  Pulmonary:     Effort: Pulmonary effort is normal. No respiratory distress.     Breath sounds: No wheezing.  Abdominal:     General: Bowel sounds are normal.     Palpations: Abdomen is soft.     Tenderness: There is no abdominal tenderness.  Musculoskeletal:     Cervical back: Neck supple.     Right lower leg: Edema present.     Left lower leg: Edema present.  Skin:    General: Skin is warm and dry.  Neurological:     Mental Status: She is alert and oriented to person, place, and time.  Psychiatric:        Mood and Affect: Mood normal.    ED Results / Procedures / Treatments   Labs (all labs ordered are listed, but only abnormal results are displayed) Labs Reviewed  BASIC METABOLIC PANEL - Abnormal; Notable for the following components:      Result Value   Potassium 3.2  (*)    Glucose, Bld 122 (*)    All other components within normal limits  CBC - Abnormal; Notable for the following components:   Hemoglobin 11.2 (*)    HCT 35.9 (*)    All other components within normal limits  BRAIN NATRIURETIC PEPTIDE  HEPATIC FUNCTION PANEL  LIPASE, BLOOD  TROPONIN I (HIGH SENSITIVITY)  TROPONIN I (HIGH SENSITIVITY)    EKG EKG Interpretation  Date/Time:  Tuesday May 06 2021 19:00:07 EDT Ventricular Rate:  83 PR Interval:  150 QRS Duration: 70 QT Interval:  390 QTC Calculation: 458 R Axis:   -21 Text Interpretation: Normal sinus rhythm Low voltage QRS Cannot rule out Anterior infarct , age undetermined Abnormal ECG Confirmed by Thayer Jew [phone]) on 05/06/2021 10:55:30 PM  Radiology DG Chest 1 View  Result Date: 05/06/2021 CLINICAL DATA:  Chest pain and shortness of breath for 2 hours. EXAM: CHEST  1 VIEW COMPARISON:  Chest x-ray 03/14/2020, CT chest 04/08/2017 FINDINGS: The heart and mediastinal contours are unchanged. No focal consolidation. No pulmonary edema. No pleural effusion. No pneumothorax. No acute osseous abnormality. Bilateral shoulder degenerative changes. IMPRESSION: No active disease. Electronically Signed   By: Iven Finn M.D.   On: 05/06/2021 20:04    Procedures Procedures   Medications Ordered in ED Medications - No data to display  ED Course  I have reviewed the triage vital signs and the nursing notes.  Pertinent labs & imaging results that were available during my care of the patient were reviewed by me and considered in my medical decision making (see chart for details).  Clinical Course as of 05/07/21 0245  Wed May 07, 2021  0243 Spoke with hospitalist for admission.  Patient high risk with a heart score 7.  Hospitalist feels she  does not likely warrant admission.  Have cardiology evaluate.  Discussed with Dr. Blossom Hoops, cardiology fellow who will assess the patient. [CH]  0245 Outside hospital records reviewed.  She  did have a cardiac catheterization 2018 which showed moderate coronary artery disease.  Medical management recommended. [CH]    Clinical Course User Index [CH] Jaylyn Iyer, Barbette Hair, MD   MDM Rules/Calculators/A&P                           Presents with chest pain.  She is overall nontoxic-appearing and vital signs are reassuring on my evaluation.  She is currently chest pain-free after nitroglycerin.  Her history is somewhat concerning for ACS.  She has multiple risk factors with a heart score 7.  Chest pain is both exertional and improved with nitroglycerin.  Patient does also endorse some GI symptoms that she developed while in the waiting room.  EKG shows nonspecific changes but no ST elevation.  Initial troponin negative.  Second troponin pending.  Last chest pain was 3 to 6 hours ago.  Chest x-ray without pneumothorax or pneumonia.  Repeat troponin is stable at 8.  While her troponin profile is reassuring, I feel she is too high risk for discharge home for evaluation as she has not had an ischemic evaluation recently and does not have a cardiologist.  We will plan for admission and cardiology evaluation.  Heart score 7. Final Clinical Impression(s) / ED Diagnoses Final diagnoses:  Precordial pain    Rx / DC Orders ED Discharge Orders     None        Ercelle Winkles, Barbette Hair, MD 05/07/21 [phone]

## 2021-05-06 NOTE — ED Provider Notes (Signed)
Emergency Medicine Provider Triage Evaluation Note  Charlotte Stuart , a 70 y.o. female  was evaluated in triage.  Pt complains of chest pain and sob that started 2 hours pta. Ble swelling started yesterday  Review of Systems  Positive: Chest pain, sob, leg swelling Negative: Cough, fever  Physical Exam  BP 135/64 (BP Location: Left Arm)   Pulse 84   Temp 99 F (37.2 C) (Oral)   Resp 18   Ht 5\' 1"  (1.549 m)   Wt 108.9 kg   SpO2 100%   BMI 45.35 kg/m  Gen:   Awake, no distress   Resp:  Normal effort  MSK:   Moves extremities without difficulty  Other:  Edema to ble, heart rrr, equal chest rise and fall  Medical Decision Making  Medically screening exam initiated at 7:31 PM.  Appropriate orders placed.  Charlotte Stuart was informed that the remainder of the evaluation will be completed by another provider, this initial triage assessment does not replace that evaluation, and the importance of remaining in the ED until their evaluation is complete.     Arelia Longest, PA-C 05/06/21 1931    05/08/21, DO 05/06/21 2359

## 2021-05-07 ENCOUNTER — Encounter (HOSPITAL_COMMUNITY)
Admission: EM | Disposition: A | Discharge status: Home / Self Care | Payer: Self-pay | Source: Home / Self Care | Attending: Emergency Medicine

## 2021-05-07 ENCOUNTER — Encounter (HOSPITAL_COMMUNITY): Payer: Self-pay | Admitting: Student

## 2021-05-07 ENCOUNTER — Observation Stay (HOSPITAL_BASED_OUTPATIENT_CLINIC_OR_DEPARTMENT_OTHER): Payer: Medicare HMO

## 2021-05-07 DIAGNOSIS — I25110 Atherosclerotic heart disease of native coronary artery with unstable angina pectoris: Secondary | ICD-10-CM | POA: Diagnosis not present

## 2021-05-07 DIAGNOSIS — R079 Chest pain, unspecified: Secondary | ICD-10-CM

## 2021-05-07 DIAGNOSIS — E785 Hyperlipidemia, unspecified: Secondary | ICD-10-CM | POA: Diagnosis not present

## 2021-05-07 DIAGNOSIS — I200 Unstable angina: Secondary | ICD-10-CM | POA: Diagnosis not present

## 2021-05-07 DIAGNOSIS — I10 Essential (primary) hypertension: Secondary | ICD-10-CM | POA: Diagnosis not present

## 2021-05-07 DIAGNOSIS — E119 Type 2 diabetes mellitus without complications: Secondary | ICD-10-CM | POA: Diagnosis not present

## 2021-05-07 HISTORY — PX: LEFT HEART CATH AND CORONARY ANGIOGRAPHY: CATH118249

## 2021-05-07 LAB — HEPATIC FUNCTION PANEL
ALT: 32 U/L (ref 0–44)
AST: 25 U/L (ref 15–41)
Albumin: 3.5 g/dL (ref 3.5–5.0)
Alkaline Phosphatase: 87 U/L (ref 38–126)
Bilirubin, Direct: <0.1 mg/dL (ref 0.0–0.2)
Total Bilirubin: 0.7 mg/dL (ref 0.3–1.2)
Total Protein: 6.7 g/dL (ref 6.5–8.1)

## 2021-05-07 LAB — LIPID PANEL
Cholesterol: 173 mg/dL (ref 0–200)
HDL: 54 mg/dL (ref >40)
LDL Cholesterol: 105 mg/dL — ABNORMAL HIGH (ref 0–99)
Total CHOL/HDL Ratio: 3.2 RATIO
Triglycerides: 68 mg/dL (ref <150)
VLDL: 14 mg/dL (ref 0–40)

## 2021-05-07 LAB — CBC
HCT: 33.6 % — ABNORMAL LOW (ref 36.0–46.0)
HCT: 35.7 % — ABNORMAL LOW (ref 36.0–46.0)
Hemoglobin: 10.6 g/dL — ABNORMAL LOW (ref 12.0–15.0)
Hemoglobin: 11.1 g/dL — ABNORMAL LOW (ref 12.0–15.0)
MCH: 27.9 pg (ref 26.0–34.0)
MCH: 27.8 pg (ref 26.0–34.0)
MCHC: 31.5 g/dL (ref 30.0–36.0)
MCHC: 31.1 g/dL (ref 30.0–36.0)
MCV: 88.4 fL (ref 80.0–100.0)
MCV: 89.5 fL (ref 80.0–100.0)
Platelets: 288 10*3/uL (ref 150–400)
Platelets: 278 10*3/uL (ref 150–400)
RBC: 3.8 MIL/uL — ABNORMAL LOW (ref 3.87–5.11)
RBC: 3.99 MIL/uL (ref 3.87–5.11)
RDW: 14.1 % (ref 11.5–15.5)
RDW: 14.2 % (ref 11.5–15.5)
WBC: 8.4 10*3/uL (ref 4.0–10.5)
WBC: 9.9 10*3/uL (ref 4.0–10.5)
nRBC: 0 % (ref 0.0–0.2)
nRBC: 0 % (ref 0.0–0.2)

## 2021-05-07 LAB — BASIC METABOLIC PANEL
Anion gap: 7 (ref 5–15)
BUN: 11 mg/dL (ref 8–23)
CO2: 27 mmol/L (ref 22–32)
Calcium: 9.2 mg/dL (ref 8.9–10.3)
Chloride: 108 mmol/L (ref 98–111)
Creatinine, Ser: 0.77 mg/dL (ref 0.44–1.00)
GFR, Estimated: >60 mL/min (ref >60)
Glucose, Bld: 114 mg/dL — ABNORMAL HIGH (ref 70–99)
Potassium: 3.5 mmol/L (ref 3.5–5.1)
Sodium: 142 mmol/L (ref 135–145)

## 2021-05-07 LAB — BRAIN NATRIURETIC PEPTIDE: B Natriuretic Peptide: 84.8 pg/mL (ref 0.0–100.0)

## 2021-05-07 LAB — HIV ANTIBODY (ROUTINE TESTING W REFLEX): HIV Screen 4th Generation wRfx: NONREACTIVE

## 2021-05-07 LAB — ECHOCARDIOGRAM COMPLETE
AR max vel: 2.25 cm2
AV Area VTI: 2.41 cm2
AV Area mean vel: 2.08 cm2
AV Mean grad: 6 mmHg
AV Peak grad: 8.4 mmHg
Ao pk vel: 1.45 m/s
Area-P 1/2: 4.58 cm2
Height: 61 in
S' Lateral: 2.8 cm
Single Plane A4C EF: 61.4 %
Weight: 3840 oz

## 2021-05-07 LAB — RESP PANEL BY RT-PCR (FLU A&B, COVID) ARPGX2
Influenza A by PCR: NEGATIVE
Influenza B by PCR: NEGATIVE
SARS Coronavirus 2 by RT PCR: NEGATIVE

## 2021-05-07 LAB — CBG MONITORING, ED
Glucose-Capillary: 112 mg/dL — ABNORMAL HIGH (ref 70–99)
Glucose-Capillary: 160 mg/dL — ABNORMAL HIGH (ref 70–99)

## 2021-05-07 LAB — HEMOGLOBIN A1C
Hgb A1c MFr Bld: 8.6 % — ABNORMAL HIGH (ref 4.8–5.6)
Mean Plasma Glucose: 200.12 mg/dL

## 2021-05-07 LAB — T4, FREE: Free T4: 0.83 ng/dL (ref 0.61–1.12)

## 2021-05-07 LAB — GLUCOSE, CAPILLARY
Glucose-Capillary: 318 mg/dL — ABNORMAL HIGH (ref 70–99)
Glucose-Capillary: 383 mg/dL — ABNORMAL HIGH (ref 70–99)

## 2021-05-07 LAB — LIPASE, BLOOD: Lipase: 23 U/L (ref 11–51)

## 2021-05-07 LAB — CREATININE, SERUM
Creatinine, Ser: 0.9 mg/dL (ref 0.44–1.00)
GFR, Estimated: >60 mL/min (ref >60)

## 2021-05-07 LAB — HEPARIN LEVEL (UNFRACTIONATED): Heparin Unfractionated: 0.28 IU/mL — ABNORMAL LOW (ref 0.30–0.70)

## 2021-05-07 LAB — TROPONIN I (HIGH SENSITIVITY): Troponin I (High Sensitivity): 8 ng/L (ref <18)

## 2021-05-07 LAB — TSH: TSH: 5.151 u[IU]/mL — ABNORMAL HIGH (ref 0.350–4.500)

## 2021-05-07 SURGERY — LEFT HEART CATH AND CORONARY ANGIOGRAPHY
Anesthesia: LOCAL

## 2021-05-07 MED ORDER — LISINOPRIL 10 MG PO TABS
10.0000 mg | ORAL_TABLET | Freq: Every day | ORAL | Status: DC
  Administered 2021-05-07: 10 mg via ORAL
  Filled 2021-05-07: qty 1

## 2021-05-07 MED ORDER — MIDAZOLAM HCL 2 MG/2ML IJ SOLN
INTRAMUSCULAR | Status: DC | PRN
  Administered 2021-05-07: 1 mg via INTRAVENOUS

## 2021-05-07 MED ORDER — ATORVASTATIN CALCIUM 80 MG PO TABS
80.0000 mg | ORAL_TABLET | Freq: Every day | ORAL | Status: DC
  Administered 2021-05-07 – 2021-05-08 (×2): 80 mg via ORAL
  Filled 2021-05-07: qty 2
  Filled 2021-05-07: qty 1

## 2021-05-07 MED ORDER — ACETAMINOPHEN 325 MG PO TABS: 650.0000 mg | ORAL_TABLET | ORAL | Status: DC | PRN

## 2021-05-07 MED ORDER — INSULIN ASPART 100 UNIT/ML IJ SOLN
0.0000 [IU] | Freq: Every day | INTRAMUSCULAR | Status: DC
  Administered 2021-05-07: 5 [IU] via SUBCUTANEOUS

## 2021-05-07 MED ORDER — FUROSEMIDE 20 MG PO TABS
20.0000 mg | ORAL_TABLET | Freq: Every day | ORAL | Status: DC
  Administered 2021-05-07 – 2021-05-08 (×2): 20 mg via ORAL
  Filled 2021-05-07 (×2): qty 1

## 2021-05-07 MED ORDER — ASPIRIN EC 81 MG PO TBEC
81.0000 mg | DELAYED_RELEASE_TABLET | Freq: Every day | ORAL | Status: DC
  Administered 2021-05-07 – 2021-05-08 (×2): 81 mg via ORAL
  Filled 2021-05-07 (×2): qty 1

## 2021-05-07 MED ORDER — METOPROLOL SUCCINATE ER 25 MG PO TB24
25.0000 mg | ORAL_TABLET | Freq: Every day | ORAL | Status: DC
  Administered 2021-05-07: 25 mg via ORAL
  Filled 2021-05-07: qty 1

## 2021-05-07 MED ORDER — SODIUM CHLORIDE 0.9 % IV SOLN: 250.0000 mL | INTRAVENOUS | Status: DC | PRN

## 2021-05-07 MED ORDER — HEPARIN (PORCINE) 25000 UT/250ML-% IV SOLN
1250.0000 [IU]/h | INTRAVENOUS | Status: DC
  Administered 2021-05-07: 1100 [IU]/h via INTRAVENOUS
  Filled 2021-05-07: qty 250

## 2021-05-07 MED ORDER — VERAPAMIL HCL 2.5 MG/ML IV SOLN
INTRAVENOUS | Status: AC
  Filled 2021-05-07: qty 2

## 2021-05-07 MED ORDER — VERAPAMIL HCL 2.5 MG/ML IV SOLN
INTRAVENOUS | Status: DC | PRN
  Administered 2021-05-07: 10 mL via INTRA_ARTERIAL

## 2021-05-07 MED ORDER — HEPARIN (PORCINE) IN NACL 1000-0.9 UT/500ML-% IV SOLN
INTRAVENOUS | Status: AC
  Filled 2021-05-07: qty 1000

## 2021-05-07 MED ORDER — LISINOPRIL-HYDROCHLOROTHIAZIDE 10-12.5 MG PO TABS: 1.0000 | ORAL_TABLET | Freq: Every day | ORAL | Status: DC

## 2021-05-07 MED ORDER — HEPARIN (PORCINE) IN NACL 1000-0.9 UT/500ML-% IV SOLN
INTRAVENOUS | Status: DC | PRN
  Administered 2021-05-07 (×2): 500 mL

## 2021-05-07 MED ORDER — HEPARIN SODIUM (PORCINE) 1000 UNIT/ML IJ SOLN
INTRAMUSCULAR | Status: DC | PRN
  Administered 2021-05-07: 5000 [IU] via INTRAVENOUS

## 2021-05-07 MED ORDER — SODIUM CHLORIDE 0.9 % IV SOLN: INTRAVENOUS | Status: DC

## 2021-05-07 MED ORDER — FENTANYL CITRATE (PF) 100 MCG/2ML IJ SOLN
INTRAMUSCULAR | Status: DC | PRN
  Administered 2021-05-07: 50 ug via INTRAVENOUS

## 2021-05-07 MED ORDER — METHYLPREDNISOLONE SODIUM SUCC 125 MG IJ SOLR
125.0000 mg | Freq: Once | INTRAMUSCULAR | Status: AC
  Administered 2021-05-07: 125 mg via INTRAVENOUS
  Filled 2021-05-07: qty 2

## 2021-05-07 MED ORDER — INSULIN ASPART 100 UNIT/ML IJ SOLN
0.0000 [IU] | Freq: Three times a day (TID) | INTRAMUSCULAR | Status: DC
  Administered 2021-05-07: 4 [IU] via SUBCUTANEOUS
  Administered 2021-05-07: 15 [IU] via SUBCUTANEOUS

## 2021-05-07 MED ORDER — IOHEXOL 350 MG/ML SOLN
INTRAVENOUS | Status: DC | PRN
  Administered 2021-05-07: 45 mL via INTRA_ARTERIAL

## 2021-05-07 MED ORDER — SODIUM CHLORIDE 0.9% FLUSH: 3.0000 mL | INTRAVENOUS | Status: DC | PRN

## 2021-05-07 MED ORDER — PANTOPRAZOLE SODIUM 40 MG PO TBEC
40.0000 mg | DELAYED_RELEASE_TABLET | Freq: Every day | ORAL | Status: DC
Start: 2021-05-07 — End: 2021-05-08
  Administered 2021-05-07 – 2021-05-08 (×2): 40 mg via ORAL
  Filled 2021-05-07 (×2): qty 1

## 2021-05-07 MED ORDER — HYDROCHLOROTHIAZIDE 12.5 MG PO CAPS: 12.5000 mg | ORAL_CAPSULE | Freq: Every day | ORAL | Status: DC

## 2021-05-07 MED ORDER — SODIUM CHLORIDE 0.9% FLUSH
3.0000 mL | Freq: Two times a day (BID) | INTRAVENOUS | Status: DC
  Administered 2021-05-07: 3 mL via INTRAVENOUS

## 2021-05-07 MED ORDER — ENOXAPARIN SODIUM 40 MG/0.4ML IJ SOSY
40.0000 mg | PREFILLED_SYRINGE | INTRAMUSCULAR | Status: DC
  Administered 2021-05-08: 40 mg via SUBCUTANEOUS
  Filled 2021-05-07: qty 0.4

## 2021-05-07 MED ORDER — HEPARIN SODIUM (PORCINE) 1000 UNIT/ML IJ SOLN
INTRAMUSCULAR | Status: AC
  Filled 2021-05-07: qty 1

## 2021-05-07 MED ORDER — LIDOCAINE HCL (PF) 1 % IJ SOLN
INTRAMUSCULAR | Status: DC | PRN
  Administered 2021-05-07: 2 mL

## 2021-05-07 MED ORDER — ASPIRIN EC 81 MG PO TBEC: 81.0000 mg | DELAYED_RELEASE_TABLET | Freq: Every day | ORAL | Status: DC

## 2021-05-07 MED ORDER — LIDOCAINE HCL (PF) 1 % IJ SOLN
INTRAMUSCULAR | Status: AC
  Filled 2021-05-07: qty 30

## 2021-05-07 MED ORDER — FENTANYL CITRATE (PF) 100 MCG/2ML IJ SOLN
INTRAMUSCULAR | Status: AC
  Filled 2021-05-07: qty 2

## 2021-05-07 MED ORDER — DIPHENHYDRAMINE HCL 25 MG PO CAPS
25.0000 mg | ORAL_CAPSULE | Freq: Once | ORAL | Status: AC
  Administered 2021-05-07: 25 mg via ORAL
  Filled 2021-05-07: qty 1

## 2021-05-07 MED ORDER — ONDANSETRON HCL 4 MG/2ML IJ SOLN: 4.0000 mg | Freq: Four times a day (QID) | INTRAMUSCULAR | Status: DC | PRN

## 2021-05-07 MED ORDER — LISINOPRIL 10 MG PO TABS
20.0000 mg | ORAL_TABLET | Freq: Every day | ORAL | Status: DC
  Administered 2021-05-07 – 2021-05-08 (×2): 20 mg via ORAL
  Filled 2021-05-07 (×2): qty 2

## 2021-05-07 MED ORDER — HEPARIN BOLUS VIA INFUSION
4000.0000 [IU] | Freq: Once | INTRAVENOUS | Status: AC
  Administered 2021-05-07: 4000 [IU] via INTRAVENOUS
  Filled 2021-05-07: qty 4000

## 2021-05-07 MED ORDER — SODIUM CHLORIDE 0.9% FLUSH
3.0000 mL | Freq: Two times a day (BID) | INTRAVENOUS | Status: DC
  Administered 2021-05-07 – 2021-05-08 (×2): 3 mL via INTRAVENOUS

## 2021-05-07 MED ORDER — MIDAZOLAM HCL 2 MG/2ML IJ SOLN
INTRAMUSCULAR | Status: AC
  Filled 2021-05-07: qty 2

## 2021-05-07 MED ORDER — NITROGLYCERIN 0.4 MG SL SUBL: 0.4000 mg | SUBLINGUAL_TABLET | SUBLINGUAL | Status: DC | PRN

## 2021-05-07 MED ORDER — METOPROLOL SUCCINATE ER 50 MG PO TB24
50.0000 mg | ORAL_TABLET | Freq: Every day | ORAL | Status: DC
  Administered 2021-05-08: 50 mg via ORAL
  Filled 2021-05-07: qty 1

## 2021-05-07 MED ORDER — INSULIN ASPART 100 UNIT/ML IJ SOLN
6.0000 [IU] | Freq: Three times a day (TID) | INTRAMUSCULAR | Status: DC
  Administered 2021-05-07 – 2021-05-08 (×2): 6 [IU] via SUBCUTANEOUS

## 2021-05-07 MED ORDER — INFLUENZA VAC A&B SA ADJ QUAD 0.5 ML IM PRSY
0.5000 mL | PREFILLED_SYRINGE | INTRAMUSCULAR | Status: AC
  Administered 2021-05-08: 0.5 mL via INTRAMUSCULAR
  Filled 2021-05-07: qty 0.5

## 2021-05-07 SURGICAL SUPPLY — 9 items
CATH INFINITI 5FR JK (CATHETERS) ×1 IMPLANT
DEVICE RAD COMP TR BAND LRG (VASCULAR PRODUCTS) ×1 IMPLANT
GLIDESHEATH SLEND SS 6F .021 (SHEATH) ×1 IMPLANT
GUIDEWIRE INQWIRE 1.5J.035X260 (WIRE) IMPLANT
INQWIRE 1.5J .035X260CM (WIRE) ×2
KIT HEART LEFT (KITS) ×2 IMPLANT
PACK CARDIAC CATHETERIZATION (CUSTOM PROCEDURE TRAY) ×2 IMPLANT
TRANSDUCER W/STOPCOCK (MISCELLANEOUS) ×2 IMPLANT
TUBING CIL FLEX 10 FLL-RA (TUBING) ×2 IMPLANT

## 2021-05-07 NOTE — ED Notes (Signed)
RN spoke to cath lab, a provider will be assessing pt this AM to confirm if she will get cath. Clarified AM meds with Victorino Dike PA in cath lab.

## 2021-05-07 NOTE — Progress Notes (Signed)
Inpatient Diabetes Program Recommendations  AACE/ADA: New Consensus Statement on Inpatient Glycemic Control (2015)  Target Ranges:  Prepandial:   less than 140 mg/dL      Peak postprandial:   less than 180 mg/dL (1-2 hours)      Critically ill patients:  140 - 180 mg/dL   Lab Results  Component Value Date   GLUCAP 160 (H) 05/07/2021   HGBA1C 8.6 (H) 05/07/2021    Review of Glycemic Control  Diabetes history: type 2 Outpatient Diabetes medications: Humulin U-500 insulin 100 units in am, 50 units in pm (depending on blood sugar), Victoza 1.8 mg, recently prescribed Ozempic instead of Victoza, but has not taken Ozempic yet.  Current orders for Inpatient glycemic control: New orders: Novolog RESISTANT correction scale TID & Hs scale, Novolog 6 units TID.  Inpatient Diabetes Program Recommendations:   Received diabetes coordinator consult. Spoke with patient at the bedside. States that she was diagnosed with diabetes 30 years ago. Has been on U-500 insulin for about 10 years. States that her last dose of U-500 insulin was 9/12 at dinner time (took 50 units of U-500 insulin at that time). States that she does have low blood sugars from time to time. She checks blood sugars 4X/day. She has tried using a continuous glucose monitor, but prefers finger sticks.  States that she has recently seen a diabetes specialist in [place],Dr. Doer. The patient was changed to Ozempic instead of Victoza, but patient has not taken the Ozempic yet. States that her HgbA1C had been 8.1% recently. Patient knows her diabetes well and states that it has always been hard to control. States that her diet is improving and that she is feeling better since making small changes. Will continue to follow with the specialist.   Will continue to monitor blood sugars while in the hospital.   Smith Mince RN BSN CDE Diabetes Coordinator Pager: [phone]  8am-5pm

## 2021-05-07 NOTE — ED Notes (Signed)
Pt sitting up in stretcher. No distress noted, VSS. Obtained blood glucose. Pt aware of cath this AM, NPO status. Denies needs, denies questions. Call light in reach.

## 2021-05-07 NOTE — ED Notes (Signed)
Vital signs stable. 

## 2021-05-07 NOTE — Progress Notes (Addendum)
 Progress Note  Patient Name: Charlotte Stuart Date of Encounter: 05/07/2021  [facility] Cardiologist: New to [facility]  Subjective   She reports being chest pain free as long as she is not up moving around. Had chest pain and SOB when ambulating from chair to gurney when switching rooms in the ED which resolved with rest. She reported increased swelling over the past couple days and reports she did eat a lot of fried food recently. She also wonders if her gallbladder could be contributing - she describes gallbladder attacks ever 2-3 months though work-up in the past has only revealed gallbladder sludge.   Inpatient Medications    Scheduled Meds:  aspirin EC  81 mg Oral Daily   atorvastatin  80 mg Oral Daily   diphenhydrAMINE  25 mg Oral Once   lisinopril  10 mg Oral Daily   And   hydrochlorothiazide  12.5 mg Oral Daily   insulin aspart  0-20 Units Subcutaneous TID WC   insulin aspart  0-5 Units Subcutaneous QHS   insulin aspart  6 Units Subcutaneous TID WC   methylPREDNISolone (SOLU-MEDROL) injection  125 mg Intravenous Once   metoprolol succinate  25 mg Oral Daily   pantoprazole  40 mg Oral Daily   Continuous Infusions:  heparin 1,100 Units/hr (05/07/21 0504)   PRN Meds: acetaminophen, nitroGLYCERIN, ondansetron (ZOFRAN) IV   Vital Signs    Vitals:   05/07/21 0930 05/07/21 1000 05/07/21 1045 05/07/21 1100  BP: 118/69 (!) 146/57 135/85 (!) 151/63  Pulse: 78 76 81 80  Resp: 16 14 19 14  Temp:      TempSrc:      SpO2: 95% 99% 100% 98%  Weight:      Height:       No intake or output data in the 24 hours ending 05/07/21 1128 Last 3 Weights 05/06/2021 07/14/2010 05/14/2010  Weight (lbs) 240 lb 221 lb 8 oz 221 lb  Weight (kg) 108.863 kg 100.472 kg 100.245 kg      Telemetry    Sinus rhythm - Personally Reviewed  ECG    No new tracings since admission - Personally Reviewed  Physical Exam   GEN: No acute distress.   Neck: No JVD Cardiac: RRR, +  murmur, no rubs or gallops.  Respiratory: Clear to auscultation bilaterally. GI: Soft, obese, nontender, non-distended  MS: No edema; No deformity. Neuro:  Nonfocal  Psych: Normal affect   Labs    High Sensitivity Troponin:   Recent Labs  Lab 05/06/21 1934 05/06/21 2324  TROPONINIHS 8 8      Chemistry Recent Labs  Lab 05/06/21 1934 05/06/21 2335 05/07/21 0643  NA 143  --  142  K 3.2*  --  3.5  CL 108  --  108  CO2 26  --  27  GLUCOSE 122*  --  114*  BUN 10  --  11  CREATININE 0.72  --  0.77  CALCIUM 9.3  --  9.2  PROT  --  6.7  --   ALBUMIN  --  3.5  --   AST  --  25  --   ALT  --  32  --   ALKPHOS  --  87  --   BILITOT  --  0.7  --   GFRNONAA >60  --  >60  ANIONGAP 9  --  7     Hematology Recent Labs  Lab 05/06/21 1934 05/07/21 0643  WBC 7.6 8.4  RBC 3.96   3.80*  HGB 11.2* 10.6*  HCT 35.9* 33.6*  MCV 90.7 88.4  MCH 28.3 27.9  MCHC 31.2 31.5  RDW 14.1 14.1  PLT 243 288    BNP Recent Labs  Lab 05/06/21 1934 05/07/21 0643  BNP 89.0 84.8     DDimer No results for input(s): DDIMER in the last 168 hours.   Radiology    DG Chest 1 View  Result Date: 05/06/2021 CLINICAL DATA:  Chest pain and shortness of breath for 2 hours. EXAM: CHEST  1 VIEW COMPARISON:  Chest x-ray 03/14/2020, CT chest 04/08/2017 FINDINGS: The heart and mediastinal contours are unchanged. No focal consolidation. No pulmonary edema. No pleural effusion. No pneumothorax. No acute osseous abnormality. Bilateral shoulder degenerative changes. IMPRESSION: No active disease. Electronically Signed   By: Morgane  Naveau M.D.   On: 05/06/2021 20:04    Cardiac Studies   Cardiac catheterization 07/2017 [facility], mLAD 25%, Lcx prox Cx 45%, mRCA 45%, normal CO with high wedge 28 mmHg, moderate Group 2 PH.  Patient Profile     70 y.o. female with a PMH of moderate non-obstructive CAD, HTN, HLD, and DM type 2, and obesity, who was admitted by cardiology for unstable angina.    Assessment & Plan    1. Unstable angina in patient with moderate non-obstructive CAD: patient presented with exertional chest pain and SOB. Symptoms relieved with SL nitro. HsTrop negative. EKG non-ischemic. She has known mild-moderate non-obstructive CAD on LHC in 2018. Favor definitive evaluation with LHC  - Keep NPO for LHC  - Shared Decision Making/Informed Consent{ The risks [stroke (1 in 1000), death (1 in 1000), kidney failure [usually temporary] (1 in 500), bleeding (1 in 200), allergic reaction [possibly serious] (1 in 200)], benefits (diagnostic support and management of coronary artery disease) and alternatives of a cardiac catheterization were discussed in detail with Ms. Persichetti and she is willing to proceed. - She reports contrast allergy - will place order for IV solumedrol 125 now and benadryl 25mg at 2pm in anticipation of LHC this afternoon - Continue heparin gtt for now - Continue aspirin and statin  2. HTN: BP generally elevated above goal of <130/80.  - Continue lisinopril-HCTZ - Continue metoprolol succinate - could consider transition to carvedilol for improved BP control - Further med adjustments pending LHC and echo results - May benefit from more aggressive diuretics  3. HLD: LDL 105; goal <70. Home atorvastatin was increased from 20mg daily to 80mg daily - Continue atorvastatin 80mg daily - Will need repeat FLP and LFTs in 6-8 weeks for close monitoring  4. DM type 2: A1C 8.6 this admission; goal <7. On insulin, glipizide, metformin, and victoza - Continue insulin and ISS this admission - Will ask DM coordinator to see given poor control despite multiple therapies - Consider addition of SGLT2-inhibitor  5. Elevated TSH: no prior history of hypothyroidism - Will add on Free T3/4 to AM labs     For questions or updates, please contact [facility] Please consult [email or web address] for contact info under        Signed, Krista M. Kroeger, PA-C  05/07/2021,  11:28 AM    Personally seen and examined. Agree with above.  Agree with proceeding with heart catheterization given her known moderate obstructive coronary artery disease and left heart catheterization 2018.  Symptoms of angina are present.  Continue with blood pressure control diabetes control.  Has contrast allergy obtaining Solu-Medrol and Benadryl.  35 minutes spent with patient, review of data, discussion with   team  Donato Schultz, MD

## 2021-05-07 NOTE — H&P (Signed)
Cardiology Admission History and Physical:   Patient ID: Charlotte Stuart MRN: [id number]; DOB: 1951/08/22   Admission date: 05/06/2021  PCP:  Loraine Leriche., MD   [facility] Cardiologist:  None        Chief Complaint:  Chest pain  Patient Profile:   Charlotte Stuart is a 70 y.o. female with diabetes, hiatal hernia, HTN, HLD, who is being seen 05/07/2021 for the evaluation of chest pain.  History of Present Illness:   Charlotte Stuart was in her usual state of health today when she developed chest pain at rest.  The pain is pressure like, non-radiating, and located to her central chest.  She called EMS and received aspirin and nitroglycerin en route to the ED.  Nitroglycerin relieved her pain.  She was doing okay without recurrent pain and explains that she had reproducible substernal chest pain that was worse with ambulation and relieved almost immediately with rest.  She is chest pain free at rest.  She has not been having worsening symptoms of late and this I new onset for her.  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  No bleeding issues.  No significant shortness of breath, orthopnea, PND.  She has some issues with GERD/abdominal pain but this feels different.  ECG NSR with non-specific ST changes  Troponin 8, 8.  Cardiac catheterization 07/2017 [facility], mLAD 25%, Lcx prox Cx 45%, mRCA 45%, normal CO with high wedge 28 mmHg, moderate Group 2 PH.  Past Medical History:  Diagnosis Date   CAD (coronary artery disease)    Diabetes mellitus without complication (HCC)    GERD (gastroesophageal reflux disease)    Heart murmur    Hypercalcemia    Hyperlipidemia    Hypertension    Retinal detachment    right   Sensorineural hearing loss (SNHL) of right ear with unrestricted hearing of left ear    Vitamin D deficiency     Past Surgical History:  Procedure Laterality Date   BREAST BIOPSY        Medications Prior to Admission: Prior to Admission medications   Medication Sig Start Date End Date Taking? Authorizing Provider  ACCU-CHEK SMARTVIEW test strip USE TO TEST BLOOD SUGAR TID 05/04/18   [provider]  albuterol (PROAIR HFA) 108 (90 Base) MCG/ACT inhaler Inhale into the lungs. 12/28/13   [provider]  amoxicillin-clavulanate (AUGMENTIN) 875-125 MG tablet TK 1 T PO BID FOR 10 DAYS 12/17/15   [provider]  aspirin EC 81 MG tablet Take 81 mg by mouth daily.    [provider]  atorvastatin (LIPITOR) 20 MG tablet  05/17/15   [provider]  atorvastatin (LIPITOR) 20 MG tablet TK 1 T PO HS 12/23/15   [provider]  atorvastatin (LIPITOR) 20 MG tablet Take 1 tablet by mouth daily. 04/20/17   [provider]  atropine 1 % ophthalmic solution  06/27/15   [provider]  BD PEN NEEDLE NANO U/F 32G X 4 MM MISC U UTD WITH VICTOZA 12/03/15   [provider]  brimonidine (ALPHAGAN) 0.2 % ophthalmic solution  08/02/15   [provider]  bumetanide (BUMEX) 2 MG tablet  07/01/15   [provider]  Cholecalciferol (VITAMIN D) 50 MCG (2000 UT) tablet Take by mouth. 10/29/17   [provider]  ciprofloxacin (CILOXAN) 0.3 % ophthalmic solution  06/17/15   [provider]  clotrimazole-betamethasone (LOTRISONE) cream  Apply to both feet and between toes bid for 4 weeks. 01/03/20   Marzetta Board, DPM  Continuous Blood Gluc Receiver (FREESTYLE LIBRE READER) DEVI Scan as needed up to 3 times per day as directed. Dx E11.65 04/18/18   [provider]  Continuous Blood Gluc Sensor (FREESTYLE LIBRE 14 DAY SENSOR) MISC FreeStyle Libre 14 Day Sensor kit    [provider]  Continuous Blood Gluc Sensor ([place]) MISC Inject into the skin. 04/14/18   [provider]  diazepam (VALIUM) 5 MG tablet diazepam 5 mg tablet    [provider]  Difluprednate (DUREZOL) 0.05 % EMUL INT 1 GTT INTO THE LEFT EYE BID PRF DISCOMFORT 07/31/17   [provider]  doxycycline (VIBRAMYCIN) 100 MG capsule  03/08/20   [provider]  ergocalciferol (VITAMIN D2) 1.25 MG (50000 UT) capsule TAKE 1 CAPSULE BY MOUTH 1 TIME A WEEK. START OTC VITAMIN-D EVERY DAY 2000 UNITS DAILY AFTER COMPLETING PRESCRIPTION 01/10/21   [provider]  erythromycin ophthalmic ointment  05/03/20   [provider]  FLUVIRIN SUSP ADM 0.5ML IM UTD 10/10/15   [provider]  furosemide (LASIX) 40 MG tablet TK 1 T PO  TWICE A WEEK PRN FOR FLUID RETENTION 01/01/16   [provider]  gatifloxacin (ZYMAXID) 0.5 % SOLN Place 1 drop into the right eye 4 (four) times daily. 10/03/20   [provider]  glipiZIDE (GLUCOTROL XL) 5 MG 24 hr tablet Take 5 mg by mouth daily with breakfast.    [provider]  glucose blood (ACCU-CHEK SMARTVIEW) test strip 3 (three) times daily. 04/01/21   [provider]  glucose blood test strip Accu-Chek SmartView Test Strips    [provider]  glucose blood test strip USE TO TEST BLOOD SUGAR THREE TIMES DAILY 05/24/18   [provider]  glucose blood test strip USE TO TEST BLOOD SUGAR THREE TIMES DAILY 04/20/18   [provider]  glucose blood test strip Accu-Chek SmartView Test Strips  TEST THREE TIMES DAILY AS DIRECTED.    [provider]  HUMULIN R U-500 KWIKPEN 500 UNIT/ML Claiborne Rigg  07/25/20   [provider]  hydroquinone 4 % cream hydroquinone 4 % topical cream  APPLY TO THE AFFECTED AREA(S) BY TOPICAL ROUTE 2 TIMES PER DAY IN THE MORNING AND AT BEDTIME FOR 4 WEEKS    [provider]  Insulin Syringe-Needle U-100 (INSULIN SYRINGE .5CC/31GX5/16") 31G X 5/16" 0.5 ML MISC  05/10/15   [provider]  Insulin Syringe-Needle U-100 (INSULIN SYRINGE 1CC/31GX5/16") 31G X 5/16" 1 ML MISC  02/15/14   [provider]   lactulose (CHRONULAC) 10 GM/15ML solution TK 30 ML PO Q 6 H PRN 12/19/18   [provider]  linaclotide (LINZESS) 145 MCG CAPS capsule Take by mouth. 01/02/21   [provider]  liraglutide (VICTOZA) 18 MG/3ML SOPN     [provider]  lisinopril-hydrochlorothiazide (ZESTORETIC) 10-12.5 MG tablet Take 1 tablet by mouth daily. 11/29/20   [provider]  LORazepam (ATIVAN) 1 MG tablet lorazepam 1 mg tablet    [provider]  metFORMIN (GLUCOPHAGE-XR) 500 MG 24 hr tablet Take 500 mg by mouth daily with breakfast.    [provider]  methylPREDNISolone (MEDROL DOSEPAK) 4 MG TBPK tablet  06/27/15   [provider]  metoCLOPramide (REGLAN) 5 MG tablet Take 5 mg by mouth. 12/27/13   [provider]  metoprolol succinate (TOPROL XL) 25 MG  24 hr tablet Take 25 mg by mouth. 12/28/13   [provider]  Metoprolol-hydroCHLOROthiazide (DUTOPROL PO) metoprolol su-hydrochlorothiaz    [provider]  neomycin-polymyxin b-dexamethasone (MAXITROL) 3.5-10000-0.1 OINT APPLY A SMALL AMOUNT ONTO SUTURES OR OPERATIVE SITE IN AFFECTED EYE BID FOR 3 DAYS THEN STOP 07/07/18   [provider]  NONFORMULARY OR COMPOUNDED ITEM [facility]  Onychomycosis Nail Lacquer -  Fluconazole 2%, Terbinafine 1% DMSO Apply to affected nail once daily Qty. 120 gm 3 refills 04/19/17   Edrick Kins, DPM  NONFORMULARY OR COMPOUNDED ITEM [facility]  Onychomycosis Nail Lacquer -  Fluconazole 2%, Terbinafine 1% DMSO Apply to affected nail once daily Qty. 120 gm 3 refills 10/27/17   Edrick Kins, DPM  omeprazole (PRILOSEC) 20 MG capsule  06/19/15   [provider]  omeprazole (PRILOSEC) 20 MG capsule  12/23/15   [provider]  omeprazole (PRILOSEC) 40 MG capsule Take 40 mg by mouth daily.    [provider]  oxyCODONE-acetaminophen (PERCOCET/ROXICET) 5-325 MG tablet  06/27/15   [provider]   prednisoLONE acetate (PRED FORTE) 1 % ophthalmic suspension  07/25/15   [provider]  PROLENSA 0.07 % SOLN Place 1 drop into the right eye at bedtime. 10/03/20   [provider]  rosuvastatin (CRESTOR) 10 MG tablet Pt is to take 1 at bedtime. 07/01/20   [provider]  tobramycin-dexamethasone (TOBRADEX) ophthalmic solution TobraDex 0.3 %-0.1 % eye drops,suspension  INSTILL 1 DROP INTO left EYE(S) BY OPHTHALMIC ROUTE EVERY 6 HOURS for 7 days    [provider]  traMADol Veatrice Bourbon) 50 MG tablet  06/26/15   [provider]     Allergies:    Allergies  Allergen Reactions   Iodinated Diagnostic Agents Itching and Swelling    Burning, swelling burns    Social History:   Social History   Socioeconomic History   Marital status: Single    Spouse name: Not on file   Number of children: Not on file   Years of education: Not on file   Highest education level: Not on file  Occupational History   Not on file  Tobacco Use   Smoking status: Never   Smokeless tobacco: Current    Types: Snuff  Substance and Sexual Activity   Alcohol use: Not on file   Drug use: Not on file   Sexual activity: Not on file  Other Topics Concern   Not on file  Social History Narrative   Not on file   Social Determinants of Health   Financial Resource Strain: Not on file  Food Insecurity: Not on file  Transportation Needs: Not on file  Physical Activity: Not on file  Stress: Not on file  Social Connections: Not on file  Intimate Partner Violence: Not on file    Family History:   Father died of heart attack in 39s The patient's family history is not on file.    ROS:  Please see the history of present illness.  All other ROS reviewed and negative.     Physical Exam/Data:   Vitals:   05/07/21 0130 05/07/21 0207 05/07/21 0300 05/07/21 0400  BP: (!) 113/51 (!) 152/52 (!) 127/44 (!) 137/54  Pulse: 71 85 78 81  Resp: 15 (!) 22 (!) 22 (!) 21  Temp:       TempSrc:      SpO2: 97% 100% 97% 100%  Weight:      Height:  No intake or output data in the 24 hours ending 05/07/21 0419 Last 3 Weights 05/06/2021 07/14/2010 05/14/2010  Weight (lbs) 240 lb 221 lb 8 oz 221 lb  Weight (kg) 108.863 kg 100.472 kg 100.245 kg     Body mass index is 45.35 kg/m.  General:  Well nourished, well developed, in no acute distress HEENT: normal Lymph: no adenopathy Neck: JVP 10 cm H20 Endocrine:  No thryomegaly Vascular: No carotid bruits; FA pulses 2+ bilaterally without bruits  Cardiac:  normal S1, S2; RRR; II/VI early peaking SEM Lungs:  clear to auscultation bilaterally, no wheezing, rhonchi or rales  Abd: soft, nontender, no hepatomegaly  Ext: 1+ edema Musculoskeletal:  No deformities, BUE and BLE strength normal and equal Skin: warm and dry  Neuro:  CNs 2-12 intact, no focal abnormalities noted Psych:  Normal affect    EKG:  The ECG that was done today was personally reviewed and demonstrates NSR with non-specific ST cahnges  Relevant CV Studies: Angiogram with moderate nonobstructive disease, elevated filling pressures with high wedge and normal output on PAC.  Laboratory Data:  High Sensitivity Troponin:   Recent Labs  Lab 05/06/21 1934 05/06/21 2324  TROPONINIHS 8 8      Chemistry Recent Labs  Lab 05/06/21 1934  NA 143  K 3.2*  CL 108  CO2 26  GLUCOSE 122*  BUN 10  CREATININE 0.72  CALCIUM 9.3  GFRNONAA >60  ANIONGAP 9    Recent Labs  Lab 05/06/21 2335  PROT 6.7  ALBUMIN 3.5  AST 25  ALT 32  ALKPHOS 87  BILITOT 0.7   Hematology Recent Labs  Lab 05/06/21 1934  WBC 7.6  RBC 3.96  HGB 11.2*  HCT 35.9*  MCV 90.7  MCH 28.3  MCHC 31.2  RDW 14.1  PLT 243   BNP Recent Labs  Lab 05/06/21 1934  BNP 89.0    DDimer No results for input(s): DDIMER in the last 168 hours.   Radiology/Studies:  DG Chest 1 View  Result Date: 05/06/2021 CLINICAL DATA:  Chest pain and shortness of breath for 2 hours. EXAM:  CHEST  1 VIEW COMPARISON:  Chest x-ray 03/14/2020, CT chest 04/08/2017 FINDINGS: The heart and mediastinal contours are unchanged. No focal consolidation. No pulmonary edema. No pleural effusion. No pneumothorax. No acute osseous abnormality. Bilateral shoulder degenerative changes. IMPRESSION: No active disease. Electronically Signed   By: Iven Finn M.D.   On: 05/06/2021 20:04     Assessment and Plan:   70 year old female with risk factors of morbid obesity, diabetes, hypertension, hyperlipidemia, family history of CAD, and known nonobstructive CAD (?prior MI?) presents for chest pain.  Chest pain consistent with typical angina and new for her without clear ischemia by EKG or biomarkers.  Overall I think presentation is consistent with unstable angina and she should undergo cardiac catheterization.  I discussed risks and benefits and she is agreeable.  Would start heparin as well prior to catheterization.  Problem List #Unstable angina #Diabetes #Hypertension #Hyperlipidemia  Plan #Unstable angina - ASA 81 mg daily - Heparin infusion - Atorvastatin 80 mg daily - Continue home metoprolol - TTE - LHC, NPO - Risk startification  #Diabetes - Continue home liraglutide - Hold metformin, glipizide in house - SSI while inpatient - Start SGLT2i  #Hypertension - Continue metoprolol succinate 25 mgd aily - Continue lisinopril/HCTZ 10/12.5 mg dailyi  #Hyperlipidemia - Check lipids - Atorvastatin as above   Risk Assessment/Risk Scores:    TIMI Risk Score  for Unstable Angina or Non-ST Elevation MI:   The patient's TIMI risk score is 4, which indicates a 20% risk of all cause mortality, new or recurrent myocardial infarction or need for urgent revascularization in the next 14 days.  Severity of Illness: The appropriate patient status for this patient is OBSERVATION. Observation status is judged to be reasonable and necessary in order to provide the required intensity of service to  ensure the patient's safety. The patient's presenting symptoms, physical exam findings, and initial radiographic and laboratory data in the context of their medical condition is felt to place them at decreased risk for further clinical deterioration. Furthermore, it is anticipated that the patient will be medically stable for discharge from the hospital within 2 midnights of admission. The following factors support the patient status of observation.   " The patient's presenting symptoms include Chest pain. " The physical exam findings include Murmur. " The initial radiographic and laboratory data are abnormal ECG.   For questions or updates, please contact [facility] Please consult [email or web address] for contact info under     Signed, Delight Hoh, MD  05/07/2021 4:19 AM

## 2021-05-07 NOTE — Progress Notes (Signed)
ANTICOAGULATION CONSULT NOTE - Follow-up Consult  Pharmacy Consult for heparin Indication: chest pain/ACS  Allergies  Allergen Reactions   Iodinated Diagnostic Agents Itching and Swelling    Burning, swelling burns    Patient Measurements: Height: 5\' 1"  (154.9 cm) Weight: 108.9 kg (240 lb) IBW/kg (Calculated) : 47.8 Heparin Dosing Weight: 75 kg  Vital Signs: Temp: 98.7 F (37.1 C) (09/14 0605) Temp Source: Oral (09/14 0605) BP: 152/67 (09/14 1245) Pulse Rate: 79 (09/14 1245)  Labs: Recent Labs    05/06/21 1934 05/06/21 2324 05/07/21 0643  HGB 11.2*  --  10.6*  HCT 35.9*  --  33.6*  PLT 243  --  288  CREATININE 0.72  --  0.77  TROPONINIHS 8 8  --      Estimated Creatinine Clearance: 75.6 mL/min (by C-G formula based on SCr of 0.77 mg/dL).   Medical History: Past Medical History:  Diagnosis Date   CAD (coronary artery disease)    Diabetes mellitus without complication (HCC)    GERD (gastroesophageal reflux disease)    Heart murmur    Hypercalcemia    Hyperlipidemia    Hypertension    Retinal detachment    right   Sensorineural hearing loss (SNHL) of right ear with unrestricted hearing of left ear    Vitamin D deficiency     Assessment: 70 year old female presents to [facility] with chest pain. Cardiac enzymes negative x2. Patient not on anticoagulants prior to admit. Scheduled for [facility] 9/14. Pharmacy consulted to dose heparin.  Heparin level of 0.28 is subtherapeutic on 1100 units/hr. Hgb down from yesterday (11.2>10.6, ~12-13 is baseline), plt are normal. No infusions problems or signs of bleeding noted.   Goal of Therapy:  Heparin level 0.3-0.7 units/ml Monitor platelets by anticoagulation protocol: Yes   Plan:  Increase heparin infusion to 1250 units/hr 6 hour heparin level Daily CBC, heparin level Monitor for s/sx of bleeding  10/14, PharmD PGY1 Pharmacy Resident 05/07/2021  1:32 PM  Please check [email or web address] for unit-specific pharmacy phone  numbers.

## 2021-05-07 NOTE — Interval H&P Note (Signed)
Cath Lab Visit (complete for each Cath Lab visit)  Clinical Evaluation Leading to the Procedure:   ACS: Yes.   Unstable angina  Non-ACS:  n/a    History and Physical Interval Note:  05/07/2021 4:18 PM  Charlotte Stuart  has presented today for surgery, with the diagnosis of unstable angina.  The various methods of treatment have been discussed with the patient and family. After consideration of risks, benefits and other options for treatment, the patient has consented to  Procedure(s): LEFT HEART CATH AND CORONARY ANGIOGRAPHY (N/A) as a surgical intervention.  The patient's history has been reviewed, patient examined, no change in status, stable for surgery.  I have reviewed the patient's chart and labs.  Questions were answered to the patient's satisfaction.     Lorine Bears

## 2021-05-07 NOTE — ED Notes (Signed)
Diabetes educator is at the bedside.

## 2021-05-07 NOTE — H&P (View-Only) (Signed)
Progress Note  Patient Name: Charlotte Stuart Date of Encounter: 05/07/2021  [facility] Cardiologist: New to [facility]  Subjective   She reports being chest pain free as long as she is not up moving around. Had chest pain and SOB when ambulating from chair to gurney when switching rooms in the ED which resolved with rest. She reported increased swelling over the past couple days and reports she did eat a lot of fried food recently. She also wonders if her gallbladder could be contributing - she describes gallbladder attacks ever 2-3 months though work-up in the past has only revealed gallbladder sludge.   Inpatient Medications    Scheduled Meds:  aspirin EC  81 mg Oral Daily   atorvastatin  80 mg Oral Daily   diphenhydrAMINE  25 mg Oral Once   lisinopril  10 mg Oral Daily   And   hydrochlorothiazide  12.5 mg Oral Daily   insulin aspart  0-20 Units Subcutaneous TID WC   insulin aspart  0-5 Units Subcutaneous QHS   insulin aspart  6 Units Subcutaneous TID WC   methylPREDNISolone (SOLU-MEDROL) injection  125 mg Intravenous Once   metoprolol succinate  25 mg Oral Daily   pantoprazole  40 mg Oral Daily   Continuous Infusions:  heparin 1,100 Units/hr (05/07/21 0504)   PRN Meds: acetaminophen, nitroGLYCERIN, ondansetron (ZOFRAN) IV   Vital Signs    Vitals:   05/07/21 0930 05/07/21 1000 05/07/21 1045 05/07/21 1100  BP: 118/69 (!) 146/57 135/85 (!) 151/63  Pulse: 78 76 81 80  Resp: 16 14 19 14   Temp:      TempSrc:      SpO2: 95% 99% 100% 98%  Weight:      Height:       No intake or output data in the 24 hours ending 05/07/21 1128 Last 3 Weights 05/06/2021 07/14/2010 05/14/2010  Weight (lbs) 240 lb 221 lb 8 oz 221 lb  Weight (kg) 108.863 kg 100.472 kg 100.245 kg      Telemetry    Sinus rhythm - Personally Reviewed  ECG    No new tracings since admission - Personally Reviewed  Physical Exam   GEN: No acute distress.   Neck: No JVD Cardiac: RRR, +  murmur, no rubs or gallops.  Respiratory: Clear to auscultation bilaterally. GI: Soft, obese, nontender, non-distended  MS: No edema; No deformity. Neuro:  Nonfocal  Psych: Normal affect   Labs    High Sensitivity Troponin:   Recent Labs  Lab 05/06/21 1934 05/06/21 2324  TROPONINIHS 8 8      Chemistry Recent Labs  Lab 05/06/21 1934 05/06/21 2335 05/07/21 0643  NA 143  --  142  K 3.2*  --  3.5  CL 108  --  108  CO2 26  --  27  GLUCOSE 122*  --  114*  BUN 10  --  11  CREATININE 0.72  --  0.77  CALCIUM 9.3  --  9.2  PROT  --  6.7  --   ALBUMIN  --  3.5  --   AST  --  25  --   ALT  --  32  --   ALKPHOS  --  87  --   BILITOT  --  0.7  --   GFRNONAA >60  --  >60  ANIONGAP 9  --  7     Hematology Recent Labs  Lab 05/06/21 1934 05/07/21 0643  WBC 7.6 8.4  RBC 3.96  3.80*  HGB 11.2* 10.6*  HCT 35.9* 33.6*  MCV 90.7 88.4  MCH 28.3 27.9  MCHC 31.2 31.5  RDW 14.1 14.1  PLT 243 288    BNP Recent Labs  Lab 05/06/21 1934 05/07/21 0643  BNP 89.0 84.8     DDimer No results for input(s): DDIMER in the last 168 hours.   Radiology    DG Chest 1 View  Result Date: 05/06/2021 CLINICAL DATA:  Chest pain and shortness of breath for 2 hours. EXAM: CHEST  1 VIEW COMPARISON:  Chest x-ray 03/14/2020, CT chest 04/08/2017 FINDINGS: The heart and mediastinal contours are unchanged. No focal consolidation. No pulmonary edema. No pleural effusion. No pneumothorax. No acute osseous abnormality. Bilateral shoulder degenerative changes. IMPRESSION: No active disease. Electronically Signed   By: Tish Frederickson M.D.   On: 05/06/2021 20:04    Cardiac Studies   Cardiac catheterization 07/2017 [facility], mLAD 25%, Lcx prox Cx 45%, mRCA 45%, normal CO with high wedge 28 mmHg, moderate Group 2 PH.  Patient Profile     70 y.o. female with a PMH of moderate non-obstructive CAD, HTN, HLD, and DM type 2, and obesity, who was admitted by cardiology for unstable angina.    Assessment & Plan    1. Unstable angina in patient with moderate non-obstructive CAD: patient presented with exertional chest pain and SOB. Symptoms relieved with SL nitro. HsTrop negative. EKG non-ischemic. She has known mild-moderate non-obstructive CAD on LHC in 2018. Favor definitive evaluation with LHC  - Keep NPO for LHC  - Shared Decision Making/Informed Consent{ The risks [stroke (1 in 1000), death (1 in 1000), kidney failure [usually temporary] (1 in 500), bleeding (1 in 200), allergic reaction [possibly serious] (1 in 200)], benefits (diagnostic support and management of coronary artery disease) and alternatives of a cardiac catheterization were discussed in detail with Charlotte Stuart and she is willing to proceed. - She reports contrast allergy - will place order for IV solumedrol 125 now and benadryl 25mg  at 2pm in anticipation of LHC this afternoon - Continue heparin gtt for now - Continue aspirin and statin  2. HTN: BP generally elevated above goal of <130/80.  - Continue lisinopril-HCTZ - Continue metoprolol succinate - could consider transition to carvedilol for improved BP control - Further med adjustments pending LHC and echo results - May benefit from more aggressive diuretics  3. HLD: LDL 105; goal <70. Home atorvastatin was increased from 20mg  daily to 80mg  daily - Continue atorvastatin 80mg  daily - Will need repeat FLP and LFTs in 6-8 weeks for close monitoring  4. DM type 2: A1C 8.6 this admission; goal <7. On insulin, glipizide, metformin, and victoza - Continue insulin and ISS this admission - Will ask DM coordinator to see given poor control despite multiple therapies - Consider addition of SGLT2-inhibitor  5. Elevated TSH: no prior history of hypothyroidism - Will add on Free T3/4 to AM labs     For questions or updates, please contact [facility] Please consult [email or web address] for contact info under        Signed, , PA-C  05/07/2021,  11:28 AM    Personally seen and examined. Agree with above.  Agree with proceeding with heart catheterization given her known moderate obstructive coronary artery disease and left heart catheterization 2018.  Symptoms of angina are present.  Continue with blood pressure control diabetes control.  Has contrast allergy obtaining Solu-Medrol and Benadryl.  35 minutes spent with patient, review of data, discussion with  team  Donato Schultz, MD

## 2021-05-07 NOTE — Progress Notes (Signed)
ANTICOAGULATION CONSULT NOTE - Initial Consult  Pharmacy Consult for heparin Indication: chest pain/ACS  Allergies  Allergen Reactions   Iodinated Diagnostic Agents Itching and Swelling    Burning, swelling burns    Patient Measurements: Height: 5\' 1"  (154.9 cm) Weight: 108.9 kg (240 lb) IBW/kg (Calculated) : 47.8 Heparin Dosing Weight: 75kg  Vital Signs: Temp: 99 F (37.2 C) (09/13 1858) Temp Source: Oral (09/13 1858) BP: 137/54 (09/14 0400) Pulse Rate: 81 (09/14 0400)  Labs: Recent Labs    05/06/21 1934 05/06/21 2324  HGB 11.2*  --   HCT 35.9*  --   PLT 243  --   CREATININE 0.72  --   TROPONINIHS 8 8    Estimated Creatinine Clearance: 75.6 mL/min (by C-G formula based on SCr of 0.72 mg/dL).   Medical History: Past Medical History:  Diagnosis Date   CAD (coronary artery disease)    Diabetes mellitus without complication (HCC)    GERD (gastroesophageal reflux disease)    Heart murmur    Hypercalcemia    Hyperlipidemia    Hypertension    Retinal detachment    right   Sensorineural hearing loss (SNHL) of right ear with unrestricted hearing of left ear    Vitamin D deficiency     Assessment: 70 year old female presents to [facility] with chest pain. Cardiac enzymes negative x2. Patient not on anticoagulants prior to admit. Hgb slightly below normal range, plt count normal. New orders to start IV heparin.   Goal of Therapy:  Heparin level 0.3-0.7 units/ml Monitor platelets by anticoagulation protocol: Yes   Plan:  Give 4000 units bolus x 1 Start heparin infusion at 1100 units/hr Check anti-Xa level in 8 hours and daily while on heparin Continue to monitor H&H and platelets  [facility] PharmD., BCPS Clinical Pharmacist 05/07/2021 4:39 AM

## 2021-05-08 ENCOUNTER — Other Ambulatory Visit (HOSPITAL_COMMUNITY): Payer: Self-pay

## 2021-05-08 ENCOUNTER — Other Ambulatory Visit: Payer: Self-pay

## 2021-05-08 ENCOUNTER — Emergency Department (HOSPITAL_COMMUNITY): Payer: Medicare HMO

## 2021-05-08 ENCOUNTER — Inpatient Hospital Stay (HOSPITAL_COMMUNITY)
Admission: EM | Admit: 2021-05-08 | Discharge: 2021-05-12 | DRG: 286 | Disposition: A | Discharge status: Home / Self Care | Payer: Medicare HMO | Attending: Cardiology | Admitting: Cardiology

## 2021-05-08 ENCOUNTER — Encounter (HOSPITAL_COMMUNITY): Payer: Self-pay | Admitting: Cardiovascular Disease

## 2021-05-08 DIAGNOSIS — Z20822 Contact with and (suspected) exposure to covid-19: Secondary | ICD-10-CM | POA: Diagnosis present

## 2021-05-08 DIAGNOSIS — H9041 Sensorineural hearing loss, unilateral, right ear, with unrestricted hearing on the contralateral side: Secondary | ICD-10-CM | POA: Diagnosis present

## 2021-05-08 DIAGNOSIS — M7989 Other specified soft tissue disorders: Secondary | ICD-10-CM | POA: Diagnosis not present

## 2021-05-08 DIAGNOSIS — R946 Abnormal results of thyroid function studies: Secondary | ICD-10-CM | POA: Diagnosis present

## 2021-05-08 DIAGNOSIS — F17290 Nicotine dependence, other tobacco product, uncomplicated: Secondary | ICD-10-CM | POA: Diagnosis present

## 2021-05-08 DIAGNOSIS — I2510 Atherosclerotic heart disease of native coronary artery without angina pectoris: Secondary | ICD-10-CM | POA: Diagnosis present

## 2021-05-08 DIAGNOSIS — J810 Acute pulmonary edema: Secondary | ICD-10-CM | POA: Diagnosis present

## 2021-05-08 DIAGNOSIS — Z79891 Long term (current) use of opiate analgesic: Secondary | ICD-10-CM

## 2021-05-08 DIAGNOSIS — Z8249 Family history of ischemic heart disease and other diseases of the circulatory system: Secondary | ICD-10-CM

## 2021-05-08 DIAGNOSIS — E876 Hypokalemia: Secondary | ICD-10-CM | POA: Diagnosis present

## 2021-05-08 DIAGNOSIS — I5030 Unspecified diastolic (congestive) heart failure: Secondary | ICD-10-CM | POA: Diagnosis present

## 2021-05-08 DIAGNOSIS — Z7982 Long term (current) use of aspirin: Secondary | ICD-10-CM

## 2021-05-08 DIAGNOSIS — E785 Hyperlipidemia, unspecified: Secondary | ICD-10-CM | POA: Diagnosis present

## 2021-05-08 DIAGNOSIS — Z6841 Body Mass Index (BMI) 40.0 and over, adult: Secondary | ICD-10-CM

## 2021-05-08 DIAGNOSIS — R0603 Acute respiratory distress: Secondary | ICD-10-CM

## 2021-05-08 DIAGNOSIS — I25110 Atherosclerotic heart disease of native coronary artery with unstable angina pectoris: Principal | ICD-10-CM | POA: Diagnosis present

## 2021-05-08 DIAGNOSIS — Z91041 Radiographic dye allergy status: Secondary | ICD-10-CM

## 2021-05-08 DIAGNOSIS — I5033 Acute on chronic diastolic (congestive) heart failure: Secondary | ICD-10-CM | POA: Diagnosis not present

## 2021-05-08 DIAGNOSIS — E119 Type 2 diabetes mellitus without complications: Secondary | ICD-10-CM

## 2021-05-08 DIAGNOSIS — J9690 Respiratory failure, unspecified, unspecified whether with hypoxia or hypercapnia: Secondary | ICD-10-CM | POA: Diagnosis present

## 2021-05-08 DIAGNOSIS — R072 Precordial pain: Secondary | ICD-10-CM

## 2021-05-08 DIAGNOSIS — I200 Unstable angina: Secondary | ICD-10-CM | POA: Diagnosis present

## 2021-05-08 DIAGNOSIS — I10 Essential (primary) hypertension: Secondary | ICD-10-CM | POA: Diagnosis present

## 2021-05-08 DIAGNOSIS — I252 Old myocardial infarction: Secondary | ICD-10-CM

## 2021-05-08 DIAGNOSIS — I160 Hypertensive urgency: Secondary | ICD-10-CM | POA: Diagnosis not present

## 2021-05-08 DIAGNOSIS — G4733 Obstructive sleep apnea (adult) (pediatric): Secondary | ICD-10-CM | POA: Diagnosis present

## 2021-05-08 DIAGNOSIS — Z79899 Other long term (current) drug therapy: Secondary | ICD-10-CM

## 2021-05-08 DIAGNOSIS — J9601 Acute respiratory failure with hypoxia: Secondary | ICD-10-CM | POA: Diagnosis not present

## 2021-05-08 DIAGNOSIS — Z7984 Long term (current) use of oral hypoglycemic drugs: Secondary | ICD-10-CM

## 2021-05-08 DIAGNOSIS — Z794 Long term (current) use of insulin: Secondary | ICD-10-CM

## 2021-05-08 DIAGNOSIS — Z23 Encounter for immunization: Secondary | ICD-10-CM

## 2021-05-08 DIAGNOSIS — E559 Vitamin D deficiency, unspecified: Secondary | ICD-10-CM | POA: Diagnosis present

## 2021-05-08 DIAGNOSIS — I110 Hypertensive heart disease with heart failure: Secondary | ICD-10-CM | POA: Diagnosis present

## 2021-05-08 DIAGNOSIS — J309 Allergic rhinitis, unspecified: Secondary | ICD-10-CM | POA: Diagnosis present

## 2021-05-08 DIAGNOSIS — K219 Gastro-esophageal reflux disease without esophagitis: Secondary | ICD-10-CM | POA: Diagnosis present

## 2021-05-08 LAB — BASIC METABOLIC PANEL
Anion gap: 10 (ref 5–15)
BUN: 16 mg/dL (ref 8–23)
CO2: 26 mmol/L (ref 22–32)
Calcium: 9.2 mg/dL (ref 8.9–10.3)
Chloride: 102 mmol/L (ref 98–111)
Creatinine, Ser: 1.14 mg/dL — ABNORMAL HIGH (ref 0.44–1.00)
GFR, Estimated: 52 mL/min — ABNORMAL LOW (ref >60)
Glucose, Bld: 397 mg/dL — ABNORMAL HIGH (ref 70–99)
Potassium: 4.2 mmol/L (ref 3.5–5.1)
Sodium: 138 mmol/L (ref 135–145)

## 2021-05-08 LAB — CBC
HCT: 32.6 % — ABNORMAL LOW (ref 36.0–46.0)
Hemoglobin: 10.1 g/dL — ABNORMAL LOW (ref 12.0–15.0)
MCH: 27.7 pg (ref 26.0–34.0)
MCHC: 31 g/dL (ref 30.0–36.0)
MCV: 89.6 fL (ref 80.0–100.0)
Platelets: 263 10*3/uL (ref 150–400)
RBC: 3.64 MIL/uL — ABNORMAL LOW (ref 3.87–5.11)
RDW: 14.1 % (ref 11.5–15.5)
WBC: 10.8 10*3/uL — ABNORMAL HIGH (ref 4.0–10.5)
nRBC: 0 % (ref 0.0–0.2)

## 2021-05-08 LAB — T3, FREE: T3, Free: 3.2 pg/mL (ref 2.0–4.4)

## 2021-05-08 LAB — GLUCOSE, CAPILLARY
Glucose-Capillary: 454 mg/dL — ABNORMAL HIGH (ref 70–99)
Glucose-Capillary: 420 mg/dL — ABNORMAL HIGH (ref 70–99)
Glucose-Capillary: 342 mg/dL — ABNORMAL HIGH (ref 70–99)

## 2021-05-08 MED ORDER — LIRAGLUTIDE 18 MG/3ML ~~LOC~~ SOPN: 1.8000 mg | PEN_INJECTOR | Freq: Every day | SUBCUTANEOUS | Status: DC

## 2021-05-08 MED ORDER — LISINOPRIL 20 MG PO TABS
20.0000 mg | ORAL_TABLET | Freq: Every day | ORAL | 6 refills | Status: DC
  Filled 2021-05-08: qty 30, 30d supply, fill #0

## 2021-05-08 MED ORDER — ALBUTEROL SULFATE (2.5 MG/3ML) 0.083% IN NEBU
2.5000 mg | INHALATION_SOLUTION | Freq: Once | RESPIRATORY_TRACT | Status: AC
  Administered 2021-05-08: 2.5 mg via RESPIRATORY_TRACT
  Filled 2021-05-08: qty 3

## 2021-05-08 MED ORDER — METOPROLOL SUCCINATE ER 50 MG PO TB24
50.0000 mg | ORAL_TABLET | Freq: Every day | ORAL | 6 refills | Status: DC
  Filled 2021-05-08: qty 30, 30d supply, fill #0

## 2021-05-08 MED ORDER — FUROSEMIDE 10 MG/ML IJ SOLN
40.0000 mg | Freq: Once | INTRAMUSCULAR | Status: AC
  Administered 2021-05-09: 40 mg via INTRAVENOUS
  Filled 2021-05-08: qty 4

## 2021-05-08 MED ORDER — INSULIN ASPART 100 UNIT/ML IJ SOLN
0.0000 [IU] | Freq: Three times a day (TID) | INTRAMUSCULAR | Status: DC
Start: 2021-05-08 — End: 2021-05-08
  Administered 2021-05-08: 15 [IU] via SUBCUTANEOUS
  Administered 2021-05-08: 20 [IU] via SUBCUTANEOUS

## 2021-05-08 MED ORDER — LIVING WELL WITH DIABETES BOOK
Freq: Once | Status: AC
  Filled 2021-05-08: qty 1

## 2021-05-08 MED ORDER — COVID-19MRNA BIVAL VACC PFIZER 30 MCG/0.3ML IM SUSP
0.3000 mL | Freq: Once | INTRAMUSCULAR | Status: AC
  Administered 2021-05-08: 0.3 mL via INTRAMUSCULAR
  Filled 2021-05-08: qty 0.3

## 2021-05-08 MED ORDER — FUROSEMIDE 20 MG PO TABS
20.0000 mg | ORAL_TABLET | Freq: Every day | ORAL | 6 refills | Status: DC | PRN
  Filled 2021-05-08: qty 30, 30d supply, fill #0

## 2021-05-08 MED ORDER — INSULIN REGULAR HUMAN (CONC) 500 UNIT/ML ~~LOC~~ SOPN
50.0000 [IU] | PEN_INJECTOR | Freq: Two times a day (BID) | SUBCUTANEOUS | Status: DC
  Filled 2021-05-08: qty 3

## 2021-05-08 MED ORDER — ATORVASTATIN CALCIUM 40 MG PO TABS
40.0000 mg | ORAL_TABLET | Freq: Every day | ORAL | 6 refills | Status: DC
  Filled 2021-05-08: qty 30, 30d supply, fill #0

## 2021-05-08 MED ORDER — ROSUVASTATIN CALCIUM 20 MG PO TABS
20.0000 mg | ORAL_TABLET | Freq: Every day | ORAL | 6 refills | Status: DC
  Filled 2021-05-08: qty 30, 30d supply, fill #0

## 2021-05-08 NOTE — Discharge Summary (Addendum)
Discharge Summary    Patient ID: Charlotte Stuart MRN: [id number]; DOB: Sep 29, 1950  Admit date: 05/06/2021 Discharge date: 05/08/2021  PCP:  Loraine Leriche., MD   [facility] Providers Cardiologist:  Candee Furbish, MD        Discharge Diagnoses    Active Problems:   Unstable angina [facility])   Diagnostic Studies/Procedures    CARDIAC CATH: 05/07/2021   Ost Cx to Prox Cx lesion is 40% stenosed.   Mid LAD lesion is 30% stenosed.   Prox RCA to Mid RCA lesion is 40% stenosed.   1.  Mild to moderate nonobstructive coronary artery disease. 2.  Left ventricular angiography was not performed.  EF was normal by echo. 3.  Moderately elevated left ventricular end-diastolic pressure at 22 mmHg.   Recommendations: Recommend aggressive medical therapy for nonobstructive coronary artery disease. Some of the patient's symptoms are likely related to diastolic heart failure due to uncontrolled hypertension and untreated sleep apnea. I increased the dose of Toprol and lisinopril.  I switched hydrochlorothiazide to small dose furosemide.  ECHO: 05/07/2021  1. Left ventricular ejection fraction, by estimation, is 55 to 60%. The  left ventricle has normal function. The left ventricle has no regional  wall motion abnormalities. There is mild concentric left ventricular  hypertrophy. Left ventricular diastolic parameters are indeterminate.   2. Right ventricular systolic function is normal. The right ventricular  size is normal. There is normal pulmonary artery systolic pressure.   3. The mitral valve is normal in structure. No evidence of mitral valve regurgitation. No evidence of mitral stenosis.   4. The aortic valve is tricuspid. Aortic valve regurgitation is not  visualized. No aortic stenosis is present.   5. The inferior vena cava is normal in size with greater than 50%  respiratory variability, suggesting right atrial pressure of 3 mmHg.  _____________   History of Present  Illness     Charlotte Stuart is a 70 y.o. female with a PMH of moderate non-obstructive CAD, HTN, HLD, and DM type 2, and obesity, who was admitted 09/13 for unstable angina.  Hospital Course     Consultants: None   Charlotte Stuart was having consistent CP w/ exertion. Ez neg MI, but definitive eval was felt indicated. She was pretreated for her dye allergy and taken to the cath lab.  Cath results are above, no obstructive disease. LVEDP was 22, she was started on low-dose Lasix and her HCTZ was stopped.   BP was not well-controlled, her lisinopril was increased. Her home Toprol XL 50 mg qd was continued.   On 09/15, she was seen by Dr Percival Spanish and all data were reviewed.   Her BP was better controlled. She was not having chest pain w/ ambulation. Her pain is possibly related to her gallbladder, she is to avoid fatty foods and f/u with surgery as planned.  Her A1c was elevated and she is on high doses of insulin, plus other meds. Continue current meds and f/u with PCP and endocrinology.   Her TSH was elevated, but FreeT3 & T4 are normal.   No further inpatient workup is indicated and she is considered stable for discharge, to follow up as an outpt.   Did the patient have an acute coronary syndrome (MI, NSTEMI, STEMI, etc) this admission?:  No                               Did the patient  have a percutaneous coronary intervention (stent / angioplasty)?:  No.       _____________  Discharge Vitals Blood pressure 137/61, pulse 82, temperature 98.1 F (36.7 C), temperature source Oral, resp. rate 18, height 5' 1"  (1.549 m), weight 116.1 kg, SpO2 96 %.  Filed Weights   05/06/21 1914 05/07/21 1702 05/08/21 0321  Weight: 108.9 kg 116.4 kg 116.1 kg    Labs & Radiologic Studies    CBC Recent Labs    05/07/21 1732 05/08/21 0423  WBC 9.9 10.8*  HGB 11.1* 10.1*  HCT 35.7* 32.6*  MCV 89.5 89.6  PLT 278 102   Basic Metabolic Panel Recent Labs    05/07/21 0643 05/07/21 1732  05/08/21 0423  NA 142  --  138  K 3.5  --  4.2  CL 108  --  102  CO2 27  --  26  GLUCOSE 114*  --  397*  BUN 11  --  16  CREATININE 0.77 0.90 1.14*  CALCIUM 9.2  --  9.2   Liver Function Tests Recent Labs    05/06/21 2335  AST 25  ALT 32  ALKPHOS 87  BILITOT 0.7  PROT 6.7  ALBUMIN 3.5   Recent Labs    05/06/21 2335  LIPASE 23   High Sensitivity Troponin:   Recent Labs  Lab 05/06/21 1934 05/06/21 2324  TROPONINIHS 8 8    BNP Invalid input(s): POCBNP D-Dimer No results for input(s): DDIMER in the last 72 hours. Hemoglobin A1C Recent Labs    05/07/21 0643  HGBA1C 8.6*   Fasting Lipid Panel Recent Labs    05/07/21 0643  CHOL 173  HDL 54  LDLCALC 105*  TRIG 68  CHOLHDL 3.2   Thyroid Function Tests Recent Labs    05/07/21 0643 05/07/21 1326  TSH 5.151*  --   T3FREE  --  3.2   Free T4  Date Value Ref Range Status  05/07/2021 0.83 0.61 - 1.12 ng/dL Final    Comment:    (NOTE) Biotin ingestion may interfere with free T4 tests. If the results are inconsistent with the TSH level, previous test results, or the clinical presentation, then consider biotin interference. If needed, order repeat testing after stopping biotin. Performed at [facility], [place] [address], [place], [place] [address]     _____________  DG Chest 1 View  Result Date: 05/06/2021 CLINICAL DATA:  Chest pain and shortness of breath for 2 hours. EXAM: CHEST  1 VIEW COMPARISON:  Chest x-ray 03/14/2020, CT chest 04/08/2017 FINDINGS: The heart and mediastinal contours are unchanged. No focal consolidation. No pulmonary edema. No pleural effusion. No pneumothorax. No acute osseous abnormality. Bilateral shoulder degenerative changes. IMPRESSION: No active disease. Electronically Signed   By: Iven Finn M.D.   On: 05/06/2021 20:04   CARDIAC CATHETERIZATION  Result Date: 05/07/2021   Ost Cx to Prox Cx lesion is 40% stenosed.   Mid LAD lesion is 30% stenosed.   Prox RCA to Mid  RCA lesion is 40% stenosed. 1.  Mild to moderate nonobstructive coronary artery disease. 2.  Left ventricular angiography was not performed.  EF was normal by echo. 3.  Moderately elevated left ventricular end-diastolic pressure at 22 mmHg. Recommendations: Recommend aggressive medical therapy for nonobstructive coronary artery disease. Some of the patient's symptoms are likely related to diastolic heart failure due to uncontrolled hypertension and untreated sleep apnea. I increased the dose of Toprol and lisinopril.  I switched hydrochlorothiazide to small dose furosemide.  ECHOCARDIOGRAM COMPLETE  Result Date: 05/07/2021    ECHOCARDIOGRAM REPORT   Patient Name:   Charlotte Stuart Date of Exam: 05/07/2021 Medical Rec #:  [id number]         Height:       61.0 in Accession #:    [id number]        Weight:       240.0 lb Date of Birth:  05/16/1951         BSA:          2.041 m Patient Age:    36 years          BP:           146/57 mmHg Patient Gender: F                 HR:           81 bpm. Exam Location:  Inpatient Procedure: 2D Echo, Cardiac Doppler and Color Doppler Indications:    R07.9* Chest pain, unspecified  History:        Patient has no prior history of Echocardiogram examinations.                 Signs/Symptoms:Chest Pain.  Sonographer:    [place] Referring Phys: [id number] [place]  1. Left ventricular ejection fraction, by estimation, is 55 to 60%. The left ventricle has normal function. The left ventricle has no regional wall motion abnormalities. There is mild concentric left ventricular hypertrophy. Left ventricular diastolic parameters are indeterminate.  2. Right ventricular systolic function is normal. The right ventricular size is normal. There is normal pulmonary artery systolic pressure.  3. The mitral valve is normal in structure. No evidence of mitral valve regurgitation. No evidence of mitral stenosis.  4. The aortic valve is tricuspid. Aortic valve regurgitation is not  visualized. No aortic stenosis is present.  5. The inferior vena cava is normal in size with greater than 50% respiratory variability, suggesting right atrial pressure of 3 mmHg. FINDINGS  Left Ventricle: Left ventricular ejection fraction, by estimation, is 55 to 60%. The left ventricle has normal function. The left ventricle has no regional wall motion abnormalities. The left ventricular internal cavity size was normal in size. There is  mild concentric left ventricular hypertrophy. Left ventricular diastolic parameters are indeterminate. Right Ventricle: The right ventricular size is normal. No increase in right ventricular wall thickness. Right ventricular systolic function is normal. There is normal pulmonary artery systolic pressure. The tricuspid regurgitant velocity is 1.64 m/s, and  with an assumed right atrial pressure of 3 mmHg, the estimated right ventricular systolic pressure is 28.3 mmHg. Left Atrium: Left atrial size was normal in size. Right Atrium: Right atrial size was normal in size. Pericardium: There is no evidence of pericardial effusion. Mitral Valve: The mitral valve is normal in structure. There is mild thickening of the mitral valve leaflet(s). No evidence of mitral valve regurgitation. No evidence of mitral valve stenosis. Tricuspid Valve: The tricuspid valve is normal in structure. Tricuspid valve regurgitation is trivial. No evidence of tricuspid stenosis. Aortic Valve: The aortic valve is tricuspid. Aortic valve regurgitation is not visualized. No aortic stenosis is present. Aortic valve mean gradient measures 6.0 mmHg. Aortic valve peak gradient measures 8.4 mmHg. Aortic valve area, by VTI measures 2.41 cm. Pulmonic Valve: The pulmonic valve was normal in structure. Pulmonic valve regurgitation is trivial. No evidence of pulmonic stenosis. Aorta: The aortic root is normal in size and structure. Venous: The inferior  vena cava is normal in size with greater than 50% respiratory  variability, suggesting right atrial pressure of 3 mmHg. IAS/Shunts: No atrial level shunt detected by color flow Doppler.  LEFT VENTRICLE PLAX 2D LVIDd:         4.00 cm     Diastology LVIDs:         2.80 cm     LV e' medial:    9.57 cm/s LV PW:         1.22 cm     LV E/e' medial:  10.2 LV IVS:        1.10 cm     LV e' lateral:   7.72 cm/s LVOT diam:     1.80 cm     LV E/e' lateral: 12.6 LV SV:         90 LV SV Index:   44 LVOT Area:     2.54 cm  LV Volumes (MOD) LV vol d, MOD A4C: 35.2 ml LV vol s, MOD A4C: 13.6 ml LV SV MOD A4C:     35.2 ml RIGHT VENTRICLE RV S prime:     11.30 cm/s TAPSE (M-mode): 3.0 cm LEFT ATRIUM             Index       RIGHT ATRIUM           Index LA diam:        2.50 cm 1.22 cm/m  RA Area:     14.40 cm LA Vol (A2C):   21.2 ml 10.39 ml/m RA Volume:   33.50 ml  16.41 ml/m LA Vol (A4C):   22.8 ml 11.17 ml/m LA Biplane Vol: 23.0 ml 11.27 ml/m  AORTIC VALVE                    PULMONIC VALVE AV Area (Vmax):    2.25 cm     PV Vmax:       0.52 m/s AV Area (Vmean):   2.08 cm     PV Peak grad:  1.1 mmHg AV Area (VTI):     2.41 cm AV Vmax:           145.00 cm/s AV Vmean:          114.000 cm/s AV VTI:            0.372 m AV Peak Grad:      8.4 mmHg AV Mean Grad:      6.0 mmHg LVOT Vmax:         128.00 cm/s LVOT Vmean:        93.100 cm/s LVOT VTI:          0.352 m LVOT/AV VTI ratio: 0.95  AORTA Ao Root diam: 3.00 cm Ao Asc diam:  3.00 cm MITRAL VALVE               TRICUSPID VALVE MV Area (PHT): 4.58 cm    TR Peak grad:   10.8 mmHg MV E velocity: 97.50 cm/s  TR Vmax:        164.00 cm/s MV A velocity: 86.30 cm/s MV E/A ratio:  1.13        SHUNTS                            Systemic VTI:  0.35 m  Systemic Diam: 1.80 cm Skeet Latch MD Electronically signed by Skeet Latch MD Signature Date/Time: 05/07/2021/2:28:13 PM    Final    Disposition   Pt is being discharged home today in good condition.  Follow-up Plans & Appointments     Follow-up Information      Loraine Leriche., MD. Go on 05/19/2021.   Specialty: Internal Medicine Why: @1 :40pm Contact information: [place] [address] [phone]         Jerline Pain, MD .   Specialty: Cardiology Contact information: [address] [address] [phone]                Discharge Instructions     Call MD for:  redness, tenderness, or signs of infection (pain, swelling, redness, odor or green/yellow discharge around incision site)   Complete by: As directed    Diet - low sodium heart healthy   Complete by: As directed    Diet Carb Modified   Complete by: As directed    Increase activity slowly   Complete by: As directed        Discharge Medications   Allergies as of 05/08/2021       Reactions   Iodinated Diagnostic Agents Itching, Swelling   Burning, swelling burns        Medication List     STOP taking these medications    lisinopril-hydrochlorothiazide 10-12.5 MG tablet Commonly known as: ZESTORETIC       TAKE these medications    glucose blood test strip Accu-Chek SmartView Test Strips   glucose blood test strip Accu-Chek SmartView Test Strips  TEST THREE TIMES DAILY AS DIRECTED.   glucose blood test strip USE TO TEST BLOOD SUGAR THREE TIMES DAILY   Accu-Chek SmartView test strip Generic drug: glucose blood USE TO TEST BLOOD SUGAR TID   glucose blood test strip USE TO TEST BLOOD SUGAR THREE TIMES DAILY   Accu-Chek SmartView test strip Generic drug: glucose blood 3 (three) times daily.   albuterol 108 (90 Base) MCG/ACT inhaler Commonly known as: VENTOLIN HFA Inhale 2 puffs into the lungs every 6 (six) hours as needed for wheezing.   ARTIFICIAL TEARS OP Place 1 drop into both eyes in the morning, at noon, in the evening, and at bedtime.   aspirin EC 81 MG tablet Take 81 mg by mouth daily.   atorvastatin 40 MG tablet Commonly known as: LIPITOR Take 1 tablet (40 mg total) by mouth  daily. Start taking on: May 09, 2021 What changed:  medication strength how much to take   BD Pen Needle Nano U/F 32G X 4 MM Misc Generic drug: Insulin Pen Needle U UTD WITH VICTOZA   clotrimazole-betamethasone cream Commonly known as: Lotrisone Apply to both feet and between toes bid for 4 weeks.   Vitamin D (Ergocalciferol) 1.25 MG (50000 UNIT) Caps capsule Commonly known as: DRISDOL Take 50,000 Units by mouth every 7 (seven) days.   ergocalciferol 1.25 MG (50000 UT) capsule Commonly known as: VITAMIN D2 TAKE 1 CAPSULE BY MOUTH 1 TIME A WEEK. START OTC VITAMIN-D EVERY DAY 2000 UNITS DAILY AFTER COMPLETING PRESCRIPTION   erythromycin ophthalmic ointment Place 1 application into the left eye daily.   FreeStyle [employer] Scan as needed up to 3 times per day as directed. Dx E11.65   FreeStyle Libre 14 Day Sensor Misc FreeStyle Rainbow 14 Day Sensor kit   [employer] Misc Inject into the skin.  furosemide 20 MG tablet Commonly known as: LASIX Take 1 tablet (20 mg total) by mouth daily as needed.   HumuLIN R U-500 KwikPen 500 UNIT/ML KwikPen Generic drug: insulin regular human CONCENTRATED Inject 50-100 Units into the skin 2 (two) times daily with a meal. 100 units in AM and 50 units in PM   INSULIN SYRINGE 1CC/31GX5/16" 31G X 5/16" 1 ML Misc   INSULIN SYRINGE .5CC/31GX5/16" 31G X 5/16" 0.5 ML Misc   linaclotide 145 MCG Caps capsule Commonly known as: LINZESS Take 145 mcg by mouth daily.   liraglutide 18 MG/3ML Sopn Commonly known as: VICTOZA Inject 1.8 mg into the skin daily.   lisinopril 20 MG tablet Commonly known as: ZESTRIL Take 1 tablet (20 mg total) by mouth daily. Start taking on: May 09, 2021   metoprolol succinate 50 MG 24 hr tablet Commonly known as: TOPROL-XL Take 1 tablet (50 mg total) by mouth daily. What changed:  medication strength how much to take   NONFORMULARY OR COMPOUNDED ITEM [facility]   Onychomycosis Nail Lacquer -  Fluconazole 2%, Terbinafine 1% DMSO Apply to affected nail once daily Qty. 120 gm 3 refills   NONFORMULARY OR COMPOUNDED ITEM [facility]  Onychomycosis Nail Lacquer -  Fluconazole 2%, Terbinafine 1% DMSO Apply to affected nail once daily Qty. 120 gm 3 refills   omeprazole 40 MG capsule Commonly known as: PRILOSEC Take 40 mg by mouth daily.   rosuvastatin 10 MG tablet Commonly known as: CRESTOR Take 10 mg by mouth daily.           Outstanding Labs/Studies   None  Duration of Discharge Encounter   Greater than 30 minutes including physician time.  Signed, Rosaria Ferries, PA-C 05/08/2021, 12:05 PM   Patient seen and examined.  Plan as discussed in my rounding note for today and outlined above. Jeneen Rinks Zahra Peffley  05/08/2021  1:21 PM

## 2021-05-08 NOTE — Progress Notes (Addendum)
Progress Note  Patient Name: Charlotte Stuart Date of Encounter: 05/08/2021  Primary Cardiologist:   Donato Schultz, MD   Subjective   No further pain.  No SOB.  Anxious to go home.   Inpatient Medications    Scheduled Meds:  aspirin EC  81 mg Oral Daily   atorvastatin  80 mg Oral Daily   enoxaparin (LOVENOX) injection  40 mg Subcutaneous Q24H   furosemide  20 mg Oral Daily   influenza vaccine adjuvanted  0.5 mL Intramuscular Tomorrow-1000   insulin aspart  0-20 Units Subcutaneous TID WC   insulin regular human CONCENTRATED  50 Units Subcutaneous BID WC   liraglutide  1.8 mg Subcutaneous Daily   lisinopril  20 mg Oral Daily   metoprolol succinate  50 mg Oral Daily   pantoprazole  40 mg Oral Daily   sodium chloride flush  3 mL Intravenous Q12H   sodium chloride flush  3 mL Intravenous Q12H   Continuous Infusions:  sodium chloride     sodium chloride     PRN Meds: sodium chloride, sodium chloride, acetaminophen, nitroGLYCERIN, ondansetron (ZOFRAN) IV, sodium chloride flush, sodium chloride flush   Vital Signs    Vitals:   05/07/21 2154 05/07/21 2354 05/08/21 0321 05/08/21 1023  BP: 136/66 133/61  137/61  Pulse:  73  82  Resp: 18 20  18   Temp:  97.7 F (36.5 C)  98.1 F (36.7 C)  TempSrc:  Oral  Oral  SpO2: 96% 94%  96%  Weight:   116.1 kg   Height:        Intake/Output Summary (Last 24 hours) at 05/08/2021 1045 Last data filed at 05/08/2021 1031 Gross per 24 hour  Intake 187.6 ml  Output --  Net 187.6 ml   Filed Weights   05/06/21 1914 05/07/21 1702 05/08/21 0321  Weight: 108.9 kg 116.4 kg 116.1 kg    Telemetry    NSR - Personally Reviewed  ECG    NSR, rate 82, axis WNL, no acute ST T wave changes.  - Personally Reviewed  Physical Exam   GEN: No acute distress.   Neck: No  JVD Cardiac: RRR, no murmurs, rubs, or gallops.  Respiratory: Clear  to auscultation bilaterally. GI: Soft, nontender, non-distended  MS:    Mild edema; No deformity.   Right radial site without bleeding or bruising Neuro:  Nonfocal  Psych: Normal affect   Labs    Chemistry Recent Labs  Lab 05/06/21 1934 05/06/21 2335 05/07/21 0643 05/07/21 1732 05/08/21 0423  NA 143  --  142  --  138  K 3.2*  --  3.5  --  4.2  CL 108  --  108  --  102  CO2 26  --  27  --  26  GLUCOSE 122*  --  114*  --  397*  BUN 10  --  11  --  16  CREATININE 0.72  --  0.77 0.90 1.14*  CALCIUM 9.3  --  9.2  --  9.2  PROT  --  6.7  --   --   --   ALBUMIN  --  3.5  --   --   --   AST  --  25  --   --   --   ALT  --  32  --   --   --   ALKPHOS  --  87  --   --   --   BILITOT  --  0.7  --   --   --   GFRNONAA >60  --  >60 >60 52*  ANIONGAP 9  --  7  --  10     Hematology Recent Labs  Lab 05/07/21 0643 05/07/21 1732 05/08/21 0423  WBC 8.4 9.9 10.8*  RBC 3.80* 3.99 3.64*  HGB 10.6* 11.1* 10.1*  HCT 33.6* 35.7* 32.6*  MCV 88.4 89.5 89.6  MCH 27.9 27.8 27.7  MCHC 31.5 31.1 31.0  RDW 14.1 14.2 14.1  PLT 288 278 263    Cardiac EnzymesNo results for input(s): TROPONINI in the last 168 hours. No results for input(s): TROPIPOC in the last 168 hours.   BNP Recent Labs  Lab 05/06/21 1934 05/07/21 0643  BNP 89.0 84.8     DDimer No results for input(s): DDIMER in the last 168 hours.   Radiology    DG Chest 1 View  Result Date: 05/06/2021 CLINICAL DATA:  Chest pain and shortness of breath for 2 hours. EXAM: CHEST  1 VIEW COMPARISON:  Chest x-ray 03/14/2020, CT chest 04/08/2017 FINDINGS: The heart and mediastinal contours are unchanged. No focal consolidation. No pulmonary edema. No pleural effusion. No pneumothorax. No acute osseous abnormality. Bilateral shoulder degenerative changes. IMPRESSION: No active disease. Electronically Signed   By: Tish Frederickson M.D.   On: 05/06/2021 20:04   CARDIAC CATHETERIZATION  Result Date: 05/07/2021   Ost Cx to Prox Cx lesion is 40% stenosed.   Mid LAD lesion is 30% stenosed.   Prox RCA to Mid RCA lesion is 40% stenosed. 1.   Mild to moderate nonobstructive coronary artery disease. 2.  Left ventricular angiography was not performed.  EF was normal by echo. 3.  Moderately elevated left ventricular end-diastolic pressure at 22 mmHg. Recommendations: Recommend aggressive medical therapy for nonobstructive coronary artery disease. Some of the patient's symptoms are likely related to diastolic heart failure due to uncontrolled hypertension and untreated sleep apnea. I increased the dose of Toprol and lisinopril.  I switched hydrochlorothiazide to small dose furosemide.   ECHOCARDIOGRAM COMPLETE  Result Date: 05/07/2021    ECHOCARDIOGRAM REPORT   Patient Name:   Charlotte Stuart Date of Exam: 05/07/2021 Medical Rec #:  [id number]         Height:       61.0 in Accession #:    [id number]        Weight:       240.0 lb Date of Birth:  [birth year]         BSA:          2.041 m Patient Age:    70 years          BP:           146/57 mmHg Patient Gender: F                 HR:           81 bpm. Exam Location:  Inpatient Procedure: 2D Echo, Cardiac Doppler and Color Doppler Indications:    R07.9* Chest pain, unspecified  History:        Patient has no prior history of Echocardiogram examinations.                 Signs/Symptoms:Chest Pain.  Sonographer:    MH Referring Phys: [id number] CARRIEL T NIPP IMPRESSIONS  1. Left ventricular ejection fraction, by estimation, is 55 to 60%. The left ventricle has normal function. The left ventricle has no regional wall motion abnormalities. There is  mild concentric left ventricular hypertrophy. Left ventricular diastolic parameters are indeterminate.  2. Right ventricular systolic function is normal. The right ventricular size is normal. There is normal pulmonary artery systolic pressure.  3. The mitral valve is normal in structure. No evidence of mitral valve regurgitation. No evidence of mitral stenosis.  4. The aortic valve is tricuspid. Aortic valve regurgitation is not visualized. No aortic stenosis is present.   5. The inferior vena cava is normal in size with greater than 50% respiratory variability, suggesting right atrial pressure of 3 mmHg. FINDINGS  Left Ventricle: Left ventricular ejection fraction, by estimation, is 55 to 60%. The left ventricle has normal function. The left ventricle has no regional wall motion abnormalities. The left ventricular internal cavity size was normal in size. There is  mild concentric left ventricular hypertrophy. Left ventricular diastolic parameters are indeterminate. Right Ventricle: The right ventricular size is normal. No increase in right ventricular wall thickness. Right ventricular systolic function is normal. There is normal pulmonary artery systolic pressure. The tricuspid regurgitant velocity is 1.64 m/s, and  with an assumed right atrial pressure of 3 mmHg, the estimated right ventricular systolic pressure is 13.8 mmHg. Left Atrium: Left atrial size was normal in size. Right Atrium: Right atrial size was normal in size. Pericardium: There is no evidence of pericardial effusion. Mitral Valve: The mitral valve is normal in structure. There is mild thickening of the mitral valve leaflet(s). No evidence of mitral valve regurgitation. No evidence of mitral valve stenosis. Tricuspid Valve: The tricuspid valve is normal in structure. Tricuspid valve regurgitation is trivial. No evidence of tricuspid stenosis. Aortic Valve: The aortic valve is tricuspid. Aortic valve regurgitation is not visualized. No aortic stenosis is present. Aortic valve mean gradient measures 6.0 mmHg. Aortic valve peak gradient measures 8.4 mmHg. Aortic valve area, by VTI measures 2.41 cm. Pulmonic Valve: The pulmonic valve was normal in structure. Pulmonic valve regurgitation is trivial. No evidence of pulmonic stenosis. Aorta: The aortic root is normal in size and structure. Venous: The inferior vena cava is normal in size with greater than 50% respiratory variability, suggesting right atrial pressure of 3  mmHg. IAS/Shunts: No atrial level shunt detected by color flow Doppler.  LEFT VENTRICLE PLAX 2D LVIDd:         4.00 cm     Diastology LVIDs:         2.80 cm     LV e' medial:    9.57 cm/s LV PW:         1.22 cm     LV E/e' medial:  10.2 LV IVS:        1.10 cm     LV e' lateral:   7.72 cm/s LVOT diam:     1.80 cm     LV E/e' lateral: 12.6 LV SV:         90 LV SV Index:   44 LVOT Area:     2.54 cm  LV Volumes (MOD) LV vol d, MOD A4C: 35.2 ml LV vol s, MOD A4C: 13.6 ml LV SV MOD A4C:     35.2 ml RIGHT VENTRICLE RV S prime:     11.30 cm/s TAPSE (M-mode): 3.0 cm LEFT ATRIUM             Index       RIGHT ATRIUM           Index LA diam:        2.50 cm 1.22 cm/m  RA Area:  14.40 cm LA Vol (A2C):   21.2 ml 10.39 ml/m RA Volume:   33.50 ml  16.41 ml/m LA Vol (A4C):   22.8 ml 11.17 ml/m LA Biplane Vol: 23.0 ml 11.27 ml/m  AORTIC VALVE                    PULMONIC VALVE AV Area (Vmax):    2.25 cm     PV Vmax:       0.52 m/s AV Area (Vmean):   2.08 cm     PV Peak grad:  1.1 mmHg AV Area (VTI):     2.41 cm AV Vmax:           145.00 cm/s AV Vmean:          114.000 cm/s AV VTI:            0.372 m AV Peak Grad:      8.4 mmHg AV Mean Grad:      6.0 mmHg LVOT Vmax:         128.00 cm/s LVOT Vmean:        93.100 cm/s LVOT VTI:          0.352 m LVOT/AV VTI ratio: 0.95  AORTA Ao Root diam: 3.00 cm Ao Asc diam:  3.00 cm MITRAL VALVE               TRICUSPID VALVE MV Area (PHT): 4.58 cm    TR Peak grad:   10.8 mmHg MV E velocity: 97.50 cm/s  TR Vmax:        164.00 cm/s MV A velocity: 86.30 cm/s MV E/A ratio:  1.13        SHUNTS                            Systemic VTI:  0.35 m                            Systemic Diam: 1.80 cm Chilton Si MD Electronically signed by Chilton Si MD Signature Date/Time: 05/07/2021/2:28:13 PM    Final     Cardiac Studies   Cardiac cath     Patient Profile     70 y.o. female with a PMH of moderate non-obstructive CAD, HTN, HLD, and DM type 2, and obesity, who was admitted by  cardiology for unstable angina.  Assessment & Plan    Chest pain:  No obstructive CAD.   She is scheduled to see surgery because it was suggested that her pain was related to the gallbladder.   HTN:  BP is controlled.   Continue current therapy.  LEG SWELLING:  Send home with PRN Lasix.    DYSLIPIDEMIA:  OK to use Lipitor 40 not 80 mg at discharge.    DM:  I will not adjust her insulin in patient but will refer her to Cheron Schaumann., MD for follow up of her DM not at target.    She also has an endocrinologist.    ABNORMAL TSH:  T3 and T4 are OK.   For questions or updates, please contact [facility] Please consult [email or web address] for contact info under Cardiology/STEMI.   Signed, Rollene Rotunda, MD  05/08/2021, 10:45 AM

## 2021-05-08 NOTE — Care Management Obs Status (Signed)
MEDICARE OBSERVATION STATUS NOTIFICATION   Patient Details  Name: KAMARIAH FRUCHTER MRN: [id number] Date of Birth: 1951/04/01   Medicare Observation Status Notification Given:  Yes    Leone Haven, RN 05/08/2021, 9:48 AM

## 2021-05-08 NOTE — ED Triage Notes (Signed)
Pt BIB [facility] for SOB and chest pain after being discharged this morning. EMS reports initial O2 86% on RA increased to 100% on 15L NRB. Pt had full cardiac work up when admitted on 9-13. EMS administered 324mg  aspirin and 2 doses of nitro en route which helped a little.  Pt initial pressure 220/110, down to 186/94 on arrival. Pt A&O x4 throughout transport and reports  8/10 pain at this time. The only thing pt did not take today was her lasix

## 2021-05-08 NOTE — Discharge Instructions (Signed)
AVOID FATTY FOODS  Follow up with Surgery as scheduled.  PLEASE REMEMBER TO BRING ALL OF YOUR MEDICATIONS TO EACH OF YOUR FOLLOW-UP OFFICE VISITS.  PLEASE ATTEND ALL SCHEDULED FOLLOW-UP APPOINTMENTS.   Activity: Increase activity slowly as tolerated. You may shower, but no soaking baths (or swimming) for 1 week. No driving for 2 days. No lifting over 5 lbs for 1 week. No sexual activity for 1 week.   You May Return to Work: on Monday  Wound Care: You may wash cath site gently with soap and water. Keep cath site clean and dry. If you notice pain, swelling, bleeding or pus at your cath site, please call [phone].    Cardiac Cath Site Care Refer to this sheet in the next few weeks. These instructions provide you with information on caring for yourself after your procedure. Your caregiver may also give you more specific instructions. Your treatment has been planned according to current medical practices, but problems sometimes occur. Call your caregiver if you have any problems or questions after your procedure. HOME CARE INSTRUCTIONS You may shower 24 hours after the procedure. Remove the bandage (dressing) and gently wash the site with plain soap and water. Gently pat the site dry.  Do not apply powder or lotion to the site.  Do not sit in a bathtub, swimming pool, or whirlpool for 5 to 7 days.  No bending, squatting, or lifting anything over 10 pounds (4.5 kg) as directed by your caregiver.  Inspect the site at least twice daily.  Do not drive home if you are discharged the same day of the procedure. Have someone else drive you.  You may drive 24 hours after the procedure unless otherwise instructed by your caregiver.  What to expect: Any bruising will usually fade within 1 to 2 weeks.  Blood that collects in the tissue (hematoma) may be painful to the touch. It should usually decrease in size and tenderness within 1 to 2 weeks.  SEEK IMMEDIATE MEDICAL CARE IF: You have unusual pain  at the site or down the affected limb.  You have redness, warmth, swelling, or pain at the site.  You have drainage (other than a small amount of blood on the dressing).  You have chills.  You have a fever or persistent symptoms for more than 72 hours.  You have a fever and your symptoms suddenly get worse.  Your leg becomes pale, cool, tingly, or numb.  You have heavy bleeding from the site. Hold pressure on the site.  Document Released: 09/12/2010 Document Revised: 07/30/2011 Document Reviewed: 09/12/2010 [facility] Patient Information 2012 [place], [place].

## 2021-05-08 NOTE — Progress Notes (Signed)
Inpatient Diabetes Program Recommendations  AACE/ADA: New Consensus Statement on Inpatient Glycemic Control   Target Ranges:  Prepandial:   less than 140 mg/dL      Peak postprandial:   less than 180 mg/dL (1-2 hours)      Critically ill patients:  140 - 180 mg/dL  Results for Charlotte Stuart, Charlotte Stuart (MRN [id number]) as of 05/08/2021 07:16  Ref. Range 05/07/2021 07:13 05/07/2021 12:21 05/07/2021 15:46 05/07/2021 20:11 05/08/2021 07:03  Glucose-Capillary Latest Ref Range: 70 - 99 mg/dL 937 (H) 902 (H) 409 (H) 383 (H) 454 (H)   Review of Glycemic Control  Diabetes history: DM2 Outpatient Diabetes medications: Humulin U-500 insulin 100 units in am, 50 units in pm (depending on blood sugar), Victoza 1.8 mg, recently prescribed Ozempic instead of Victoza, but has not taken Ozempic yet Current orders for Inpatient glycemic control: Novolog 0-20 units TID with meals, Novolog 0-5 units QHS, Novolog 6 units TID with meals  Inpatient Diabetes Program Recommendations:    Insulin: Noted patient received Solumedrol 125 mg x1 on 05/07/21 and fasting glucose 454 mg/dl today. Please consider ordering Humulin R U500 40 units BID (starting with breakfast today). If U500 is ordered, please discontinue Novolog 6 units TID meal coverage.  Thanks, Orlando Penner, RN, MSN, CDE Diabetes Coordinator Inpatient Diabetes Program [phone] (Team Pager from 8am to 5pm)

## 2021-05-08 NOTE — ED Provider Notes (Signed)
[facility] EMERGENCY DEPARTMENT Provider Note   CSN: [id number] Arrival date & time: 05/08/21  2125     History Chief Complaint  Patient presents with   Shortness of [name] is a 70 y.o. female.  HPI Patient presents the same day as being discharged from our cardiology team, now with dyspnea.  She denies pain.  She arrives via EMS, with a nonrebreather mask in place.  History is somewhat limited secondary to acuity of condition, dyspnea.  Seemingly the patient returned home, had not yet been to pharmacy to obtain her medication.  She became more dyspneic over the hours prior to EMS notification.  No fever, vomiting, fall.  Additional history obtained on chart review from discharge diagnosis and summary of hospitalization including pertinent imaging studies within the past 24 hours as below.  CARDIAC CATH: 05/07/2021   Ost Cx to Prox Cx lesion is 40% stenosed.   Mid LAD lesion is 30% stenosed.   Prox RCA to Mid RCA lesion is 40% stenosed.   1.  Mild to moderate nonobstructive coronary artery disease. 2.  Left ventricular angiography was not performed.  EF was normal by echo. 3.  Moderately elevated left ventricular end-diastolic pressure at 22 mmHg.   Recommendations: Recommend aggressive medical therapy for nonobstructive coronary artery disease. Some of the patient's symptoms are likely related to diastolic heart failure due to uncontrolled hypertension and untreated sleep apnea. I increased the dose of Toprol and lisinopril.  I switched hydrochlorothiazide to small dose furosemide.   ECHO: 05/07/2021  1. Left ventricular ejection fraction, by estimation, is 55 to 60%. The  left ventricle has normal function. The left ventricle has no regional  wall motion abnormalities. There is mild concentric left ventricular  hypertrophy. Left ventricular diastolic parameters are indeterminate.   2. Right ventricular systolic function is normal. The  right ventricular  size is normal. There is normal pulmonary artery systolic pressure.   3. The mitral valve is normal in structure. No evidence of mitral valve regurgitation. No evidence of mitral stenosis.   4. The aortic valve is tricuspid. Aortic valve regurgitation is not  visualized. No aortic stenosis is present.   5. The inferior vena cava is normal in size with greater than 50%  respiratory variability, suggesting right atrial pressure of 3 mmHg     Past Medical History:  Diagnosis Date   CAD (coronary artery disease)    Diabetes mellitus without complication (HCC)    GERD (gastroesophageal reflux disease)    Heart murmur    Hypercalcemia    Hyperlipidemia    Hypertension    Retinal detachment    right   Sensorineural hearing loss (SNHL) of right ear with unrestricted hearing of left ear    Vitamin D deficiency     Patient Active Problem List   Diagnosis Date Noted   Unstable angina ([place]) 05/07/2021   Chest pain 08/05/2020   Colon cancer screening 08/05/2020   Costochondritis 08/05/2020   Flatulence, eructation and gas pain 08/05/2020   Family history of malignant neoplasm of gastrointestinal tract 08/05/2020   Hiatal hernia 08/05/2020   Left lower quadrant pain 08/05/2020   Rectal bleeding 08/05/2020   Acute bilateral low back pain without sciatica 04/23/2020   Daytime somnolence 08/10/2017   Heart palpitations 04/19/2017   Retinal detachment, right 04/09/2017   DOE (dyspnea on exertion) 04/08/2017   Ear pain, bilateral 12/25/2016   Sensorineural hearing loss (SNHL) of right ear with unrestricted  hearing of left ear 12/25/2016   Pulsatile tinnitus of right ear 12/22/2016   Heart murmur 10/22/2015   Neck pain 09/22/2015   Hypercalcemia 09/22/2015   Hyperlipidemia 09/22/2015   Insulin long-term use ([place]) 09/22/2015   Vitamin D deficiency 09/22/2015   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 06/26/2015   Allergic rhinitis 12/27/2013    Constipation 12/27/2013   Edema 12/27/2013   GERD (gastroesophageal reflux disease) 12/27/2013   Hyperlipemia 12/27/2013   Type 2 diabetes mellitus without complication, with long-term current use of insulin ([place]) 12/27/2013   DIABETES MELLITUS, TYPE II 07/14/2010   CORONARY ATHEROSCLEROSIS NATIVE CORONARY ARTERY 05/14/2010   REDNESS OR DISCHARGE OF EYE 08/02/2007   CONTACT DERMATITIS 08/02/2007   OBESITY 06/14/2007   Essential hypertension 06/14/2007   GASTROESOPHAGEAL REFLUX DISEASE, HX OF 06/14/2007    Past Surgical History:  Procedure Laterality Date   BREAST BIOPSY     LEFT HEART CATH AND CORONARY ANGIOGRAPHY N/A 05/07/2021   Procedure: LEFT HEART CATH AND CORONARY ANGIOGRAPHY;  Surgeon: Wellington Hampshire, MD;  Location: [facility];  Service: Cardiovascular;  Laterality: N/A;     OB History   No obstetric history on file.     No family history on file.  Social History   Tobacco Use   Smoking status: Never   Smokeless tobacco: Current    Types: Snuff    Home Medications Prior to Admission medications   Medication Sig Start Date End Date Taking? Authorizing Provider  ACCU-CHEK SMARTVIEW test strip USE TO TEST BLOOD SUGAR TID 05/04/18   [provider]  albuterol (VENTOLIN HFA) 108 (90 Base) MCG/ACT inhaler Inhale 2 puffs into the lungs every 6 (six) hours as needed for wheezing. 12/28/13   [provider]  aspirin EC 81 MG tablet Take 81 mg by mouth daily.    [provider]  BD PEN NEEDLE NANO U/F 32G X 4 MM MISC U UTD WITH VICTOZA 12/03/15   [provider]  Carboxymethylcellulose Sodium (ARTIFICIAL TEARS OP) Place 1 drop into both eyes in the morning, at noon, in the evening, and at bedtime.    [provider]  clotrimazole-betamethasone (LOTRISONE) cream Apply to both feet and between toes bid for 4 weeks. Patient not taking: No sig reported 01/03/20   Marzetta Board, DPM  Continuous Blood Gluc Receiver  (FREESTYLE LIBRE READER) DEVI Scan as needed up to 3 times per day as directed. Dx E11.65 04/18/18   [provider]  Continuous Blood Gluc Sensor (FREESTYLE LIBRE 14 DAY SENSOR) MISC FreeStyle Libre 14 Day Sensor kit    [provider]  Continuous Blood Gluc Sensor ([place]) MISC Inject into the skin. 04/14/18   [provider]  ergocalciferol (VITAMIN D2) 1.25 MG (50000 UT) capsule TAKE 1 CAPSULE BY MOUTH 1 TIME A WEEK. START OTC VITAMIN-D EVERY DAY 2000 UNITS DAILY AFTER COMPLETING PRESCRIPTION 01/10/21   [provider]  erythromycin ophthalmic ointment Place 1 application into the left eye daily. 05/03/20   [provider]  furosemide (LASIX) 20 MG tablet Take 1 tablet (20 mg total) by mouth daily as needed. 05/08/21   Barrett, [name], PA-C  glucose blood (ACCU-CHEK SMARTVIEW) test strip 3 (three) times daily. 04/01/21   [provider]  glucose blood test strip Accu-Chek SmartView Test Strips    [provider]  glucose blood test strip USE TO TEST BLOOD SUGAR THREE TIMES DAILY 05/24/18   [provider]  glucose blood test strip USE TO TEST BLOOD SUGAR THREE TIMES DAILY 04/20/18   [provider]  glucose blood test strip Accu-Chek SmartView Test Strips  TEST THREE TIMES DAILY AS DIRECTED.    [provider]  insulin regular human CONCENTRATED (HUMULIN R U-500 KWIKPEN) 500 UNIT/ML KwikPen Inject 50-100 Units into the skin 2 (two) times daily with a meal. 100 units in AM and 50 units in PM    [provider]  Insulin Syringe-Needle U-100 (INSULIN SYRINGE .5CC/31GX5/16") 31G X 5/16" 0.5 ML MISC  05/10/15   [provider]  Insulin Syringe-Needle U-100 (INSULIN SYRINGE 1CC/31GX5/16") 31G X 5/16" 1 ML MISC  02/15/14   [provider]  linaclotide (LINZESS) 145 MCG CAPS capsule Take 145 mcg by mouth daily. 01/02/21   [provider]  liraglutide (VICTOZA) 18  MG/3ML SOPN Inject 1.8 mg into the skin daily.    [provider]  lisinopril (ZESTRIL) 20 MG tablet Take 1 tablet (20 mg total) by mouth daily. 05/09/21   Barrett, [name], PA-C  metoprolol succinate (TOPROL-XL) 50 MG 24 hr tablet Take 1 tablet (50 mg total) by mouth daily. 05/08/21   Barrett, [name], PA-C  NONFORMULARY OR COMPOUNDED ITEM [facility]  Onychomycosis Nail Lacquer -  Fluconazole 2%, Terbinafine 1% DMSO Apply to affected nail once daily Qty. 120 gm 3 refills Patient not taking: No sig reported 04/19/17   Edrick Kins, DPM  NONFORMULARY OR COMPOUNDED ITEM [facility]  Onychomycosis Nail Lacquer -  Fluconazole 2%, Terbinafine 1% DMSO Apply to affected nail once daily Qty. 120 gm 3 refills Patient not taking: No sig reported 10/27/17   Edrick Kins, DPM  omeprazole (PRILOSEC) 40 MG capsule Take 40 mg by mouth daily.    [provider]  rosuvastatin (CRESTOR) 20 MG tablet Take 1 tablet (20 mg total) by mouth daily. 05/08/21   Barrett, [name], PA-C  Vitamin D, Ergocalciferol, (DRISDOL) 1.25 MG (50000 UNIT) CAPS capsule Take 50,000 Units by mouth every 7 (seven) days.    [provider]    Allergies    Iodinated diagnostic agents  Review of Systems   Review of Systems  Unable to perform ROS: Acuity of condition   Physical Exam Updated Vital Signs BP (!) 156/58   Pulse 89   Temp 99.8 F (37.7 C) (Axillary)   Resp (!) 30   SpO2 100%   Physical Exam Vitals and nursing note reviewed.  Constitutional:      General: She is in acute distress.     Appearance: She is well-developed. She is obese. She is ill-appearing and diaphoretic.  HENT:     Head: Normocephalic and atraumatic.  Eyes:     Conjunctiva/sclera: Conjunctivae normal.  Cardiovascular:     Rate and Rhythm: Normal rate and regular rhythm.  Pulmonary:     Effort: Tachypnea, accessory muscle usage and respiratory distress present.     Breath sounds: Decreased breath  sounds present.  Abdominal:     General: There is no distension.  Musculoskeletal:     Right lower leg: Edema present.     Left lower leg: Edema present.  Skin:    General: Skin is warm.  Neurological:     Mental Status: She is alert and oriented to person, place, and time.     Cranial Nerves: No cranial nerve deficit.    ED Results / Procedures / Treatments   Labs (all labs ordered are listed, but only abnormal results  are displayed) Labs Reviewed  COMPREHENSIVE METABOLIC PANEL  CBC WITH DIFFERENTIAL/PLATELET  BRAIN NATRIURETIC PEPTIDE    EKG EKG Interpretation  Date/Time:  Thursday May 08 2021 21:47:03 EDT Ventricular Rate:  89 PR Interval:    QRS Duration: 70 QT Interval:  454 QTC Calculation: 552 R Axis:   -7 Text Interpretation: Sinus rhythm Low voltage QRS Artifact ST-t wave abnormality Abnormal ECG Confirmed by Carmin Muskrat [phone]) on 05/08/2021 11:25:04 PM  Radiology CARDIAC CATHETERIZATION  Result Date: 05/07/2021   Ost Cx to Prox Cx lesion is 40% stenosed.   Mid LAD lesion is 30% stenosed.   Prox RCA to Mid RCA lesion is 40% stenosed. 1.  Mild to moderate nonobstructive coronary artery disease. 2.  Left ventricular angiography was not performed.  EF was normal by echo. 3.  Moderately elevated left ventricular end-diastolic pressure at 22 mmHg. Recommendations: Recommend aggressive medical therapy for nonobstructive coronary artery disease. Some of the patient's symptoms are likely related to diastolic heart failure due to uncontrolled hypertension and untreated sleep apnea. I increased the dose of Toprol and lisinopril.  I switched hydrochlorothiazide to small dose furosemide.   DG Chest Port 1 View  Result Date: 05/08/2021 CLINICAL DATA:  Shortness of breath EXAM: PORTABLE CHEST 1 VIEW COMPARISON:  05/06/2021 FINDINGS: Cardiomegaly with interim vascular congestion and development of perihilar and basilar hazy opacity likely edema. There are probable small  effusions. No pneumothorax. IMPRESSION: Cardiomegaly with vascular congestion, probable hazy perihilar and lung base edema, and suspected small effusions Electronically Signed   By: Donavan Foil M.D.   On: 05/08/2021 22:10   ECHOCARDIOGRAM COMPLETE  Result Date: 05/07/2021    ECHOCARDIOGRAM REPORT   Patient Name:   Charlotte Stuart Date of Exam: 05/07/2021 Medical Rec #:  [id number]         Height:       61.0 in Accession #:    [id number]        Weight:       240.0 lb Date of Birth:  02-26-1951         BSA:          2.041 m Patient Age:    11 years          BP:           146/57 mmHg Patient Gender: F                 HR:           81 bpm. Exam Location:  Inpatient Procedure: 2D Echo, Cardiac Doppler and Color Doppler Indications:    R07.9* Chest pain, unspecified  History:        Patient has no prior history of Echocardiogram examinations.                 Signs/Symptoms:Chest Pain.  Sonographer:    [place] Referring Phys: [id number] [place]  1. Left ventricular ejection fraction, by estimation, is 55 to 60%. The left ventricle has normal function. The left ventricle has no regional wall motion abnormalities. There is mild concentric left ventricular hypertrophy. Left ventricular diastolic parameters are indeterminate.  2. Right ventricular systolic function is normal. The right ventricular size is normal. There is normal pulmonary artery systolic pressure.  3. The mitral valve is normal in structure. No evidence of mitral valve regurgitation. No evidence of mitral stenosis.  4. The aortic valve is tricuspid. Aortic valve regurgitation is not visualized. No aortic stenosis is present.  5. The  inferior vena cava is normal in size with greater than 50% respiratory variability, suggesting right atrial pressure of 3 mmHg. FINDINGS  Left Ventricle: Left ventricular ejection fraction, by estimation, is 55 to 60%. The left ventricle has normal function. The left ventricle has no regional wall motion  abnormalities. The left ventricular internal cavity size was normal in size. There is  mild concentric left ventricular hypertrophy. Left ventricular diastolic parameters are indeterminate. Right Ventricle: The right ventricular size is normal. No increase in right ventricular wall thickness. Right ventricular systolic function is normal. There is normal pulmonary artery systolic pressure. The tricuspid regurgitant velocity is 1.64 m/s, and  with an assumed right atrial pressure of 3 mmHg, the estimated right ventricular systolic pressure is 16.1 mmHg. Left Atrium: Left atrial size was normal in size. Right Atrium: Right atrial size was normal in size. Pericardium: There is no evidence of pericardial effusion. Mitral Valve: The mitral valve is normal in structure. There is mild thickening of the mitral valve leaflet(s). No evidence of mitral valve regurgitation. No evidence of mitral valve stenosis. Tricuspid Valve: The tricuspid valve is normal in structure. Tricuspid valve regurgitation is trivial. No evidence of tricuspid stenosis. Aortic Valve: The aortic valve is tricuspid. Aortic valve regurgitation is not visualized. No aortic stenosis is present. Aortic valve mean gradient measures 6.0 mmHg. Aortic valve peak gradient measures 8.4 mmHg. Aortic valve area, by VTI measures 2.41 cm. Pulmonic Valve: The pulmonic valve was normal in structure. Pulmonic valve regurgitation is trivial. No evidence of pulmonic stenosis. Aorta: The aortic root is normal in size and structure. Venous: The inferior vena cava is normal in size with greater than 50% respiratory variability, suggesting right atrial pressure of 3 mmHg. IAS/Shunts: No atrial level shunt detected by color flow Doppler.  LEFT VENTRICLE PLAX 2D LVIDd:         4.00 cm     Diastology LVIDs:         2.80 cm     LV e' medial:    9.57 cm/s LV PW:         1.22 cm     LV E/e' medial:  10.2 LV IVS:        1.10 cm     LV e' lateral:   7.72 cm/s LVOT diam:     1.80 cm      LV E/e' lateral: 12.6 LV SV:         90 LV SV Index:   44 LVOT Area:     2.54 cm  LV Volumes (MOD) LV vol d, MOD A4C: 35.2 ml LV vol s, MOD A4C: 13.6 ml LV SV MOD A4C:     35.2 ml RIGHT VENTRICLE RV S prime:     11.30 cm/s TAPSE (M-mode): 3.0 cm LEFT ATRIUM             Index       RIGHT ATRIUM           Index LA diam:        2.50 cm 1.22 cm/m  RA Area:     14.40 cm LA Vol (A2C):   21.2 ml 10.39 ml/m RA Volume:   33.50 ml  16.41 ml/m LA Vol (A4C):   22.8 ml 11.17 ml/m LA Biplane Vol: 23.0 ml 11.27 ml/m  AORTIC VALVE                    PULMONIC VALVE AV Area (Vmax):    2.25 cm  PV Vmax:       0.52 m/s AV Area (Vmean):   2.08 cm     PV Peak grad:  1.1 mmHg AV Area (VTI):     2.41 cm AV Vmax:           145.00 cm/s AV Vmean:          114.000 cm/s AV VTI:            0.372 m AV Peak Grad:      8.4 mmHg AV Mean Grad:      6.0 mmHg LVOT Vmax:         128.00 cm/s LVOT Vmean:        93.100 cm/s LVOT VTI:          0.352 m LVOT/AV VTI ratio: 0.95  AORTA Ao Root diam: 3.00 cm Ao Asc diam:  3.00 cm MITRAL VALVE               TRICUSPID VALVE MV Area (PHT): 4.58 cm    TR Peak grad:   10.8 mmHg MV E velocity: 97.50 cm/s  TR Vmax:        164.00 cm/s MV A velocity: 86.30 cm/s MV E/A ratio:  1.13        SHUNTS                            Systemic VTI:  0.35 m                            Systemic Diam: 1.80 cm Skeet Latch MD Electronically signed by Skeet Latch MD Signature Date/Time: 05/07/2021/2:28:13 PM    Final     Procedures Procedures   Medications Ordered in ED Medications  albuterol (PROVENTIL) (2.5 MG/3ML) 0.083% nebulizer solution 2.5 mg (2.5 mg Nebulization Given 05/08/21 2147)    ED Course  I have reviewed the triage vital signs and the nursing notes.  Pertinent labs & imaging results that were available during my care of the patient were reviewed by me and considered in my medical decision making (see chart for details).  On repeat exam after albuterol and DuoNeb the patient has improved  aeration, no longer requires nonrebreather mask.   11:48 PM Patient now on 4 L via nasal cannula, saturation 100%.  Tachypnea has improved substantially.  X-ray consistent with pulmonary congestion, concern for diastolic dysfunction given her echo from yesterday.  Labs pending, but given her new oxygen requirement, concern for fluid overload status, she received IV Lasix, will likely require admission for monitoring, management.  Dr. Joya Gaskins is aware of the patient. MDM Rules/Calculators/A&P MDM Number of Diagnoses or Management Options Respiratory distress: new, needed workup   Amount and/or Complexity of Data Reviewed Clinical lab tests: ordered and reviewed Tests in the radiology section of CPT: ordered and reviewed Tests in the medicine section of CPT: reviewed and ordered Decide to obtain previous medical records or to obtain history from someone other than the patient: yes Obtain history from someone other than the patient: yes Review and summarize past medical records: yes Discuss the patient with other providers: yes Independent visualization of images, tracings, or specimens: yes  Risk of Complications, Morbidity, and/or Mortality Presenting problems: high Diagnostic procedures: high Management options: high  Critical Care Total time providing critical care: 30-74 minutes (35)  Patient Progress Patient progress: improved   Final Clinical Impression(s) / ED Diagnoses Final diagnoses:  Respiratory distress     Carmin Muskrat, MD 05/08/21 2350

## 2021-05-09 DIAGNOSIS — I252 Old myocardial infarction: Secondary | ICD-10-CM | POA: Diagnosis not present

## 2021-05-09 DIAGNOSIS — J810 Acute pulmonary edema: Secondary | ICD-10-CM | POA: Diagnosis present

## 2021-05-09 DIAGNOSIS — I110 Hypertensive heart disease with heart failure: Secondary | ICD-10-CM | POA: Diagnosis present

## 2021-05-09 DIAGNOSIS — R946 Abnormal results of thyroid function studies: Secondary | ICD-10-CM | POA: Diagnosis present

## 2021-05-09 DIAGNOSIS — F17290 Nicotine dependence, other tobacco product, uncomplicated: Secondary | ICD-10-CM | POA: Diagnosis present

## 2021-05-09 DIAGNOSIS — Z23 Encounter for immunization: Secondary | ICD-10-CM | POA: Diagnosis present

## 2021-05-09 DIAGNOSIS — E785 Hyperlipidemia, unspecified: Secondary | ICD-10-CM | POA: Diagnosis present

## 2021-05-09 DIAGNOSIS — I5033 Acute on chronic diastolic (congestive) heart failure: Secondary | ICD-10-CM | POA: Diagnosis not present

## 2021-05-09 DIAGNOSIS — Z6841 Body Mass Index (BMI) 40.0 and over, adult: Secondary | ICD-10-CM | POA: Diagnosis not present

## 2021-05-09 DIAGNOSIS — R0602 Shortness of breath: Secondary | ICD-10-CM

## 2021-05-09 DIAGNOSIS — H9041 Sensorineural hearing loss, unilateral, right ear, with unrestricted hearing on the contralateral side: Secondary | ICD-10-CM | POA: Diagnosis present

## 2021-05-09 DIAGNOSIS — J9601 Acute respiratory failure with hypoxia: Secondary | ICD-10-CM | POA: Diagnosis not present

## 2021-05-09 DIAGNOSIS — G4733 Obstructive sleep apnea (adult) (pediatric): Secondary | ICD-10-CM | POA: Diagnosis present

## 2021-05-09 DIAGNOSIS — Z7984 Long term (current) use of oral hypoglycemic drugs: Secondary | ICD-10-CM | POA: Diagnosis not present

## 2021-05-09 DIAGNOSIS — Z794 Long term (current) use of insulin: Secondary | ICD-10-CM | POA: Diagnosis not present

## 2021-05-09 DIAGNOSIS — I2510 Atherosclerotic heart disease of native coronary artery without angina pectoris: Secondary | ICD-10-CM | POA: Diagnosis not present

## 2021-05-09 DIAGNOSIS — Z20822 Contact with and (suspected) exposure to covid-19: Secondary | ICD-10-CM | POA: Diagnosis present

## 2021-05-09 DIAGNOSIS — Z79899 Other long term (current) drug therapy: Secondary | ICD-10-CM | POA: Diagnosis not present

## 2021-05-09 DIAGNOSIS — E876 Hypokalemia: Secondary | ICD-10-CM | POA: Diagnosis present

## 2021-05-09 DIAGNOSIS — I10 Essential (primary) hypertension: Secondary | ICD-10-CM | POA: Diagnosis not present

## 2021-05-09 DIAGNOSIS — M7989 Other specified soft tissue disorders: Secondary | ICD-10-CM | POA: Diagnosis not present

## 2021-05-09 DIAGNOSIS — I160 Hypertensive urgency: Secondary | ICD-10-CM | POA: Diagnosis not present

## 2021-05-09 DIAGNOSIS — I25110 Atherosclerotic heart disease of native coronary artery with unstable angina pectoris: Secondary | ICD-10-CM | POA: Diagnosis present

## 2021-05-09 DIAGNOSIS — E559 Vitamin D deficiency, unspecified: Secondary | ICD-10-CM | POA: Diagnosis present

## 2021-05-09 DIAGNOSIS — K219 Gastro-esophageal reflux disease without esophagitis: Secondary | ICD-10-CM | POA: Diagnosis present

## 2021-05-09 DIAGNOSIS — Z7982 Long term (current) use of aspirin: Secondary | ICD-10-CM | POA: Diagnosis not present

## 2021-05-09 DIAGNOSIS — I5031 Acute diastolic (congestive) heart failure: Secondary | ICD-10-CM | POA: Diagnosis not present

## 2021-05-09 DIAGNOSIS — J309 Allergic rhinitis, unspecified: Secondary | ICD-10-CM | POA: Diagnosis present

## 2021-05-09 LAB — COMPREHENSIVE METABOLIC PANEL
ALT: 74 U/L — ABNORMAL HIGH (ref 0–44)
AST: 85 U/L — ABNORMAL HIGH (ref 15–41)
Albumin: 3.6 g/dL (ref 3.5–5.0)
Alkaline Phosphatase: 79 U/L (ref 38–126)
Anion gap: 11 (ref 5–15)
BUN: 19 mg/dL (ref 8–23)
CO2: 25 mmol/L (ref 22–32)
Calcium: 9.2 mg/dL (ref 8.9–10.3)
Chloride: 105 mmol/L (ref 98–111)
Creatinine, Ser: 0.85 mg/dL (ref 0.44–1.00)
GFR, Estimated: >60 mL/min (ref >60)
Glucose, Bld: 114 mg/dL — ABNORMAL HIGH (ref 70–99)
Potassium: 3.3 mmol/L — ABNORMAL LOW (ref 3.5–5.1)
Sodium: 141 mmol/L (ref 135–145)
Total Bilirubin: 0.7 mg/dL (ref 0.3–1.2)
Total Protein: 6.7 g/dL (ref 6.5–8.1)

## 2021-05-09 LAB — CBC WITH DIFFERENTIAL/PLATELET
Abs Immature Granulocytes: 0.06 10*3/uL (ref 0.00–0.07)
Basophils Absolute: 0 10*3/uL (ref 0.0–0.1)
Basophils Relative: 0 %
Eosinophils Absolute: 0 10*3/uL (ref 0.0–0.5)
Eosinophils Relative: 0 %
HCT: 32.9 % — ABNORMAL LOW (ref 36.0–46.0)
Hemoglobin: 10.4 g/dL — ABNORMAL LOW (ref 12.0–15.0)
Immature Granulocytes: 0 %
Lymphocytes Relative: 13 %
Lymphs Abs: 1.9 10*3/uL (ref 0.7–4.0)
MCH: 28.1 pg (ref 26.0–34.0)
MCHC: 31.6 g/dL (ref 30.0–36.0)
MCV: 88.9 fL (ref 80.0–100.0)
Monocytes Absolute: 0.8 10*3/uL (ref 0.1–1.0)
Monocytes Relative: 6 %
Neutro Abs: 11.6 10*3/uL — ABNORMAL HIGH (ref 1.7–7.7)
Neutrophils Relative %: 81 %
Platelets: 239 10*3/uL (ref 150–400)
RBC: 3.7 MIL/uL — ABNORMAL LOW (ref 3.87–5.11)
RDW: 14.1 % (ref 11.5–15.5)
WBC: 14.5 10*3/uL — ABNORMAL HIGH (ref 4.0–10.5)
nRBC: 0 % (ref 0.0–0.2)

## 2021-05-09 LAB — CREATININE, SERUM
Creatinine, Ser: 0.97 mg/dL (ref 0.44–1.00)
GFR, Estimated: >60 mL/min (ref >60)

## 2021-05-09 LAB — CBC
HCT: 33.8 % — ABNORMAL LOW (ref 36.0–46.0)
Hemoglobin: 10.5 g/dL — ABNORMAL LOW (ref 12.0–15.0)
MCH: 27.7 pg (ref 26.0–34.0)
MCHC: 31.1 g/dL (ref 30.0–36.0)
MCV: 89.2 fL (ref 80.0–100.0)
Platelets: 298 10*3/uL (ref 150–400)
RBC: 3.79 MIL/uL — ABNORMAL LOW (ref 3.87–5.11)
RDW: 14.1 % (ref 11.5–15.5)
WBC: 15.3 10*3/uL — ABNORMAL HIGH (ref 4.0–10.5)
nRBC: 0 % (ref 0.0–0.2)

## 2021-05-09 LAB — CBG MONITORING, ED
Glucose-Capillary: 266 mg/dL — ABNORMAL HIGH (ref 70–99)
Glucose-Capillary: 240 mg/dL — ABNORMAL HIGH (ref 70–99)

## 2021-05-09 LAB — SARS CORONAVIRUS 2 (TAT 6-24 HRS): SARS Coronavirus 2: NEGATIVE

## 2021-05-09 LAB — GLUCOSE, CAPILLARY: Glucose-Capillary: 215 mg/dL — ABNORMAL HIGH (ref 70–99)

## 2021-05-09 LAB — BRAIN NATRIURETIC PEPTIDE: B Natriuretic Peptide: 234.6 pg/mL — ABNORMAL HIGH (ref 0.0–100.0)

## 2021-05-09 MED ORDER — ONDANSETRON HCL 4 MG/2ML IJ SOLN
4.0000 mg | Freq: Four times a day (QID) | INTRAMUSCULAR | Status: DC | PRN
  Administered 2021-05-09: 4 mg via INTRAVENOUS
  Filled 2021-05-09: qty 2

## 2021-05-09 MED ORDER — LINACLOTIDE 145 MCG PO CAPS
145.0000 ug | ORAL_CAPSULE | Freq: Every day | ORAL | Status: DC
  Administered 2021-05-09 – 2021-05-12 (×4): 145 ug via ORAL
  Filled 2021-05-09 (×4): qty 1

## 2021-05-09 MED ORDER — PANTOPRAZOLE SODIUM 40 MG PO TBEC
40.0000 mg | DELAYED_RELEASE_TABLET | Freq: Every day | ORAL | Status: DC
  Administered 2021-05-09 – 2021-05-12 (×4): 40 mg via ORAL
  Filled 2021-05-09 (×4): qty 1

## 2021-05-09 MED ORDER — SODIUM CHLORIDE 0.9 % IV SOLN: 250.0000 mL | INTRAVENOUS | Status: DC | PRN

## 2021-05-09 MED ORDER — ACETAMINOPHEN 500 MG PO TABS: 1000.0000 mg | ORAL_TABLET | Freq: Four times a day (QID) | ORAL | Status: DC | PRN

## 2021-05-09 MED ORDER — SODIUM CHLORIDE 0.9% FLUSH: 3.0000 mL | INTRAVENOUS | Status: DC | PRN

## 2021-05-09 MED ORDER — METOPROLOL SUCCINATE ER 50 MG PO TB24
50.0000 mg | ORAL_TABLET | Freq: Every day | ORAL | Status: DC
  Administered 2021-05-09: 50 mg via ORAL
  Filled 2021-05-09: qty 2

## 2021-05-09 MED ORDER — ALBUTEROL SULFATE (2.5 MG/3ML) 0.083% IN NEBU
2.5000 mg | INHALATION_SOLUTION | Freq: Four times a day (QID) | RESPIRATORY_TRACT | Status: DC | PRN
  Administered 2021-05-09: 2.5 mg via RESPIRATORY_TRACT
  Filled 2021-05-09: qty 3

## 2021-05-09 MED ORDER — SODIUM CHLORIDE 0.9% FLUSH
3.0000 mL | Freq: Two times a day (BID) | INTRAVENOUS | Status: DC
  Administered 2021-05-09 – 2021-05-11 (×4): 3 mL via INTRAVENOUS

## 2021-05-09 MED ORDER — FUROSEMIDE 10 MG/ML IJ SOLN
40.0000 mg | Freq: Once | INTRAMUSCULAR | Status: AC
  Administered 2021-05-09: 40 mg via INTRAVENOUS
  Filled 2021-05-09: qty 4

## 2021-05-09 MED ORDER — ASPIRIN EC 81 MG PO TBEC
81.0000 mg | DELAYED_RELEASE_TABLET | Freq: Every day | ORAL | Status: DC
  Administered 2021-05-09 – 2021-05-12 (×4): 81 mg via ORAL
  Filled 2021-05-09 (×4): qty 1

## 2021-05-09 MED ORDER — ASPIRIN EC 81 MG PO TBEC: 81.0000 mg | DELAYED_RELEASE_TABLET | Freq: Every day | ORAL | Status: DC

## 2021-05-09 MED ORDER — ENOXAPARIN SODIUM 40 MG/0.4ML IJ SOSY
40.0000 mg | PREFILLED_SYRINGE | INTRAMUSCULAR | Status: DC
  Administered 2021-05-09 – 2021-05-12 (×4): 40 mg via SUBCUTANEOUS
  Filled 2021-05-09 (×4): qty 0.4

## 2021-05-09 MED ORDER — LIRAGLUTIDE 18 MG/3ML ~~LOC~~ SOPN
1.8000 mg | PEN_INJECTOR | Freq: Every day | SUBCUTANEOUS | Status: DC
  Administered 2021-05-10 – 2021-05-12 (×3): 1.8 mg via SUBCUTANEOUS
  Filled 2021-05-09: qty 3

## 2021-05-09 MED ORDER — ROSUVASTATIN CALCIUM 20 MG PO TABS
20.0000 mg | ORAL_TABLET | Freq: Every day | ORAL | Status: DC
  Administered 2021-05-09 – 2021-05-12 (×4): 20 mg via ORAL
  Filled 2021-05-09 (×4): qty 1

## 2021-05-09 MED ORDER — LISINOPRIL 40 MG PO TABS
40.0000 mg | ORAL_TABLET | Freq: Every day | ORAL | Status: DC
  Administered 2021-05-10 – 2021-05-12 (×3): 40 mg via ORAL
  Filled 2021-05-09 (×3): qty 1

## 2021-05-09 MED ORDER — LISINOPRIL 20 MG PO TABS
20.0000 mg | ORAL_TABLET | Freq: Every day | ORAL | Status: DC
  Administered 2021-05-09: 20 mg via ORAL
  Filled 2021-05-09: qty 1

## 2021-05-09 MED ORDER — POTASSIUM CHLORIDE CRYS ER 20 MEQ PO TBCR
30.0000 meq | EXTENDED_RELEASE_TABLET | Freq: Once | ORAL | Status: AC
  Administered 2021-05-09: 30 meq via ORAL
  Filled 2021-05-09: qty 1

## 2021-05-09 MED ORDER — INSULIN REGULAR HUMAN (CONC) 500 UNIT/ML ~~LOC~~ SOPN
30.0000 [IU] | PEN_INJECTOR | Freq: Two times a day (BID) | SUBCUTANEOUS | Status: DC
  Administered 2021-05-09 – 2021-05-12 (×6): 30 [IU] via SUBCUTANEOUS
  Filled 2021-05-09: qty 3

## 2021-05-09 MED ORDER — POTASSIUM CHLORIDE 10 MEQ/100ML IV SOLN
10.0000 meq | INTRAVENOUS | Status: AC
  Administered 2021-05-09 (×3): 10 meq via INTRAVENOUS
  Filled 2021-05-09 (×3): qty 100

## 2021-05-09 MED ORDER — ACETAMINOPHEN 325 MG PO TABS
650.0000 mg | ORAL_TABLET | ORAL | Status: DC | PRN
  Administered 2021-05-10 (×2): 650 mg via ORAL
  Filled 2021-05-09 (×2): qty 2

## 2021-05-09 NOTE — H&P (Signed)
Cardiology Admission History and Physical:   Patient ID: Charlotte Stuart MRN: [id number]; DOB: 09-May-1951   Admission date: 05/08/2021  PCP:  Loraine Leriche., MD   [facility] Cardiologist:  Candee Furbish, MD        Chief Complaint:  flash pulm edema  Patient Profile:   Charlotte Stuart is a 70 y.o. female with h/o non-obstructive CAD, just underwent LHC yesterday which showed no obstructive lesions, just discharged from [facility] less than 24h ago who is being seen 05/09/2021 for the evaluation of SOB and hypoxia.  History of Present Illness:   [name] was discharged from [facility] less than 24h ago after undergoing LHC which showed mild-mod non-obst CAD after she returned home and became acutely SOB. Pt states she felt fine upon leaving the hospital. She had an echo during that admission which showed LVEF 60%, indeterminate diastolic function. LVEDP was elevated on LHC at 42mHg. Pt says she got home, ate a sandwich, and then got up to go to the bathroom and suddenly felt very SOB. She panicked, got very anxious. She called EMS to come bring her back to the hospital.  Upon EMS arrival at pt's home, O2 sat was 86% on RA, and initial BP was 220/110.  She was down to 5L [place] with sats in high 90s when I saw her. Her BP has come down and she was given lasix in the ED, and she is making urine.    Past Medical History:  Diagnosis Date   CAD (coronary artery disease)    Diabetes mellitus without complication (HCC)    GERD (gastroesophageal reflux disease)    Heart murmur    Hypercalcemia    Hyperlipidemia    Hypertension    Retinal detachment    right   Sensorineural hearing loss (SNHL) of right ear with unrestricted hearing of left ear    Vitamin D deficiency     Past Surgical History:  Procedure Laterality Date   BREAST BIOPSY     LEFT HEART CATH AND CORONARY ANGIOGRAPHY N/A 05/07/2021   Procedure: LEFT HEART CATH AND CORONARY ANGIOGRAPHY;  Surgeon: AWellington Hampshire MD;  [facility];  Service: Cardiovascular;  Laterality: N/A;     Medications Prior to Admission: Prior to Admission medications   Medication Sig Start Date End Date Taking? Authorizing Provider  acetaminophen (TYLENOL) 500 MG tablet Take 1,000 mg by mouth every 6 (six) hours as needed for moderate pain.   Yes [provider]  albuterol (VENTOLIN HFA) 108 (90 Base) MCG/ACT inhaler Inhale 2 puffs into the lungs every 6 (six) hours as needed for wheezing. 12/28/13  Yes [provider]  aspirin EC 81 MG tablet Take 81 mg by mouth daily.   Yes [provider]  Carboxymethylcellulose Sodium (ARTIFICIAL TEARS OP) Place 1 drop into both eyes in the morning, at noon, in the evening, and at bedtime.   Yes [provider]  furosemide (LASIX) 20 MG tablet Take 1 tablet (20 mg total) by mouth daily as needed. Patient taking differently: Take 20 mg by mouth daily as needed for fluid or edema. 05/08/21  Yes Barrett, Rhonda G, PA-C  glucose blood test strip Accu-Chek SmartView Test Strips  TEST THREE TIMES DAILY AS DIRECTED.   Yes [provider]  insulin regular human CONCENTRATED (HUMULIN R U-500 KWIKPEN) 500 UNIT/ML KwikPen Inject 50-100 Units into the skin 2 (two) times daily with a meal. 100 units in AM and 50 units  in PM   Yes [provider]  linaclotide (LINZESS) 145 MCG CAPS capsule Take 145 mcg by mouth daily. 01/02/21  Yes [provider]  liraglutide (VICTOZA) 18 MG/3ML SOPN Inject 1.8 mg into the skin daily.   Yes [provider]  lisinopril (ZESTRIL) 20 MG tablet Take 1 tablet (20 mg total) by mouth daily. 05/09/21  Yes Barrett, [name], PA-C  metoprolol succinate (TOPROL-XL) 50 MG 24 hr tablet Take 1 tablet (50 mg total) by mouth daily. 05/08/21  Yes Barrett, [name], PA-C  omeprazole (PRILOSEC) 40 MG capsule Take 40 mg by mouth daily.   Yes [provider]  rosuvastatin (CRESTOR) 20 MG tablet Take 1  tablet (20 mg total) by mouth daily. 05/08/21  Yes Barrett, [name], PA-C  Vitamin D, Ergocalciferol, (DRISDOL) 1.25 MG (50000 UNIT) CAPS capsule Take 50,000 Units by mouth every Wednesday.   Yes [provider]  BD PEN NEEDLE NANO U/F 32G X 4 MM MISC U UTD WITH VICTOZA 12/03/15   [provider]  clotrimazole-betamethasone (LOTRISONE) cream Apply to both feet and between toes bid for 4 weeks. Patient not taking: No sig reported 01/03/20   Marzetta Board, DPM  Continuous Blood Gluc Receiver (FREESTYLE LIBRE READER) DEVI Scan as needed up to 3 times per day as directed. Dx E11.65 04/18/18   [provider]  Continuous Blood Gluc Sensor (FREESTYLE LIBRE 14 DAY SENSOR) MISC FreeStyle Libre 14 Day Sensor kit    [provider]  Continuous Blood Gluc Sensor (Finger) MISC Inject into the skin. 04/14/18   [provider]  Insulin Syringe-Needle U-100 (INSULIN SYRINGE .5CC/31GX5/16") 31G X 5/16" 0.5 ML MISC  05/10/15   [provider]  Insulin Syringe-Needle U-100 (INSULIN SYRINGE 1CC/31GX5/16") 31G X 5/16" 1 ML MISC  02/15/14   [provider]  metoprolol succinate (TOPROL-XL) 25 MG 24 hr tablet Take 25 mg by mouth daily. Patient not taking: No sig reported    [provider]  NONFORMULARY OR COMPOUNDED ITEM [facility]  Onychomycosis Nail Lacquer -  Fluconazole 2%, Terbinafine 1% DMSO Apply to affected nail once daily Qty. 120 gm 3 refills Patient not taking: No sig reported 04/19/17   Edrick Kins, DPM  NONFORMULARY OR COMPOUNDED ITEM [facility]  Onychomycosis Nail Lacquer -  Fluconazole 2%, Terbinafine 1% DMSO Apply to affected nail once daily Qty. 120 gm 3 refills Patient not taking: No sig reported 10/27/17   Edrick Kins, DPM     Allergies:    Allergies  Allergen Reactions   Iodinated Diagnostic Agents Itching and Swelling    Burning, swelling burns    Social History:    Social History   Socioeconomic History   Marital status: Single    Spouse name: Not on file   Number of children: Not on file   Years of education: Not on file   Highest education level: Not on file  Occupational History   Not on file  Tobacco Use   Smoking status: Never   Smokeless tobacco: Current    Types: Snuff  Substance and Sexual Activity   Alcohol use: Not on file   Drug use: Not on file   Sexual activity: Not on file  Other Topics Concern   Not on file  Social History Narrative   Not on file   Social Determinants of Health   Financial Resource Strain: Not on file  Food Insecurity: Not on file  Transportation Needs: Not  on file  Physical Activity: Not on file  Stress: Not on file  Social Connections: Not on file  Intimate Partner Violence: Not on file    Family History:   The patient's family history is not on file.   Non-contributory  ROS:  Please see the history of present illness.  All other ROS reviewed and negative.     Physical Exam/Data:   Vitals:   05/08/21 2300 05/09/21 0023 05/09/21 0100 05/09/21 0130  BP: (!) 156/58 130/64 (!) 124/56 (!) 124/46  Pulse: 89 91  91  Resp: (!) 30 (!) 23 (!) 25 17  Temp:      TempSrc:      SpO2: 100% 98%  99%   No intake or output data in the 24 hours ending 05/09/21 0205 Last 3 Weights 05/08/2021 05/07/2021 05/06/2021  Weight (lbs) 256 lb 256 lb 9.6 oz 240 lb  Weight (kg) 116.121 kg 116.393 kg 108.863 kg     There is no height or weight on file to calculate BMI.  General:  Well nourished, well developed, NAD HEENT: normal Neck: thick is thick; no obvious JVD Vascular: No carotid bruits; Distal pulses 2+ bilaterally   Cardiac:  normal S1, S2; RRR; no murmur  Lungs:  decreased BS at the bases Abd: obese, soft, nontender, no hepatomegaly  Ext: tr-1+ bilateral edema Musculoskeletal:  No deformities Skin: warm and dry  Neuro:  no focal abnormalities noted Psych:  Normal affect    EKG:  The ECG that was  done 05-09-21 was personally reviewed and demonstrates NSR with nonspecific ST changes  Relevant CV Studies: 05-07-21 LHC Ost Cx to Prox Cx lesion is 40% stenosed.   Mid LAD lesion is 30% stenosed.   Prox RCA to Mid RCA lesion is 40% stenosed.   1.  Mild to moderate nonobstructive coronary artery disease. 2.  Left ventricular angiography was not performed.  EF was normal by echo. 3.  Moderately elevated left ventricular end-diastolic pressure at 22 mmHg.  TTE 05-07-21 1. Left ventricular ejection fraction, by estimation, is 55 to 60%. The  left ventricle has normal function. The left ventricle has no regional  wall motion abnormalities. There is mild concentric left ventricular  hypertrophy. Left ventricular diastolic  parameters are indeterminate.   2. Right ventricular systolic function is normal. The right ventricular  size is normal. There is normal pulmonary artery systolic pressure.   3. The mitral valve is normal in structure. No evidence of mitral valve  regurgitation. No evidence of mitral stenosis.   4. The aortic valve is tricuspid. Aortic valve regurgitation is not  visualized. No aortic stenosis is present.   5. The inferior vena cava is normal in size with greater than 50%  respiratory variability, suggesting right atrial pressure of 3 mmHg.   Laboratory Data:  High Sensitivity Troponin:   Recent Labs  Lab 05/06/21 1934 05/06/21 2324  TROPONINIHS 8 8      Chemistry Recent Labs  Lab 05/08/21 0423 05/09/21 0003  NA 138 141  K 4.2 3.3*  CL 102 105  CO2 26 25  GLUCOSE 397* 114*  BUN 16 19  CREATININE 1.14* 0.85  CALCIUM 9.2 9.2  GFRNONAA 52* >60  ANIONGAP 10 11    Recent Labs  Lab 05/06/21 2335 05/09/21 0003  PROT 6.7 6.7  ALBUMIN 3.5 3.6  AST 25 85*  ALT 32 74*  ALKPHOS 87 79  BILITOT 0.7 0.7   Lipids  Recent Labs  Lab 05/07/21 [phone]  CHOL 173  TRIG 68  HDL 54  LDLCALC 105*  CHOLHDL 3.2   Hematology Recent Labs  Lab 05/08/21 0423  05/09/21 0003  WBC 10.8* 14.5*  RBC 3.64* 3.70*  HGB 10.1* 10.4*  HCT 32.6* 32.9*  MCV 89.6 88.9  MCH 27.7 28.1  MCHC 31.0 31.6  RDW 14.1 14.1  PLT 263 239   Thyroid  Recent Labs  Lab 05/07/21 0643 05/07/21 1326  TSH 5.151*  --   FREET4  --  0.83   BNP Recent Labs  Lab 05/06/21 1934 05/07/21 0643 05/08/21 0004  BNP 89.0 84.8 234.6*    DDimer No results for input(s): DDIMER in the last 168 hours.   Radiology/Studies:  DG Chest Port 1 View  Result Date: 05/08/2021 CLINICAL DATA:  Shortness of breath EXAM: PORTABLE CHEST 1 VIEW COMPARISON:  05/06/2021 FINDINGS: Cardiomegaly with interim vascular congestion and development of perihilar and basilar hazy opacity likely edema. There are probable small effusions. No pneumothorax. IMPRESSION: Cardiomegaly with vascular congestion, probable hazy perihilar and lung base edema, and suspected small effusions Electronically Signed   By: Donavan Foil M.D.   On: 05/08/2021 22:10     Assessment and Plan:   SOB: ED to do PE protocol CTA to r/o PE given sudden onset of SOB and hypoxia. Her episode may have been due to hypertensive urgency w/ BP 220/110 with resultant flash pulm edema exacerbated by baseline elevated filling pressures (elevated LVEDP on cath yesterday at baseline). She needs better BP control and diuresis. She has mild-mod non-obstructive CAD so this is not precipitated by cardiac ischemia. Her LVEF is normal with no HD significant valvular pathology. Her sx should hopefully improve w/ BP control and aggressive diuresis. CAD: mild-mod, non-obstructive as above. Cont medical management. Dyslipidemia: cont home regimen HTN: as above.  Hypokalemia: will supplement potassium    Risk Assessment/Risk Scores:       New York Heart Association (NYHA) Functional Class NYHA Class III--currently    For questions or updates, please contact [place] Please consult [email or web address] for contact info under      Signed, Rudean Curt, MD, [facility]  05/09/2021 2:05 AM

## 2021-05-09 NOTE — ED Provider Notes (Signed)
  Physical Exam  BP (!) 137/104   Pulse 83   Temp 99.8 F (37.7 C) (Axillary)   Resp (!) 22   SpO2 100%   Physical Exam  ED Course/Procedures   Clinical Course as of 05/09/21 0714  Fri May 09, 2021  0203 I spoke with Dr. Julian Reil. Recommends I call cardiology due to the fact that the patient was discharged from the cardiology service yesterday. Cardiology will admit. [AW]    Clinical Course User Index [AW] Koleen Distance, MD    Procedures  MDM  I received this patient in signout.  Patient appears to have suffered flash pulmonary edema from a hypertensive urgency.  She will be admitted to cardiology service.       Koleen Distance, MD 05/09/21 [phone]

## 2021-05-09 NOTE — Progress Notes (Signed)
   Notified by RN that patient was experiencing worsening SOB. She was given IV lasix and a breathing treatment. Upon my examination she had improvement in breathing. In no distress on exam with faint crackles at lung bases, satting at 100% on 4L O2 via [place]. O2 weaned to 2L and remained stable with sats 99-100%.   Will continue close monitoring for now.   Of note, patient with apneic episodes on telemetry. She sleeps in a recliner chronically and notes PND. She would benefit from a sleep study outpatient.   Beatriz Stallion, PA-C 05/09/21; 4:20 PM

## 2021-05-09 NOTE — ED Notes (Signed)
Cards provider at bedside. Pt reports feeling better.

## 2021-05-09 NOTE — ED Notes (Signed)
Devoria Glassing daughter [phone] would like to speak with the patient/get an update

## 2021-05-09 NOTE — Progress Notes (Signed)
Heart Failure Nurse Navigator Progress Note  HF Navigation team following this admission to assess for HV TOC appropriateness. Pt was discharged for less than 24 hours before returning for flash pulmonary edema suspect d/t HTN urgency 220/110.   Will follow for progression to determine HV TOC readiness.  Ozella Rocks, MSN, RN Heart Failure Nurse Navigator [phone]

## 2021-05-09 NOTE — Progress Notes (Signed)
Progress Note  Patient Name: Charlotte Stuart Date of Encounter: 05/09/2021  Primary Cardiologist:   Donato Schultz, MD   Subjective   Admitted earlier this morning with hypertensive urgency and flash pulmonary edema.  Currently not SOB.  No distress  Inpatient Medications    Scheduled Meds:  aspirin EC  81 mg Oral Daily   enoxaparin (LOVENOX) injection  40 mg Subcutaneous Q24H   insulin regular human CONCENTRATED  50-100 Units Subcutaneous BID WC   linaclotide  145 mcg Oral Daily   liraglutide  1.8 mg Subcutaneous Daily   lisinopril  20 mg Oral Daily   metoprolol succinate  50 mg Oral Daily   pantoprazole  40 mg Oral Daily   rosuvastatin  20 mg Oral Daily   sodium chloride flush  3 mL Intravenous Q12H   Continuous Infusions:  sodium chloride     PRN Meds: sodium chloride, acetaminophen, albuterol, ondansetron (ZOFRAN) IV, sodium chloride flush   Vital Signs    Vitals:   05/09/21 0400 05/09/21 0500 05/09/21 0600 05/09/21 0700  BP: (!) 147/56 (!) 135/51 (!) 137/104 135/66  Pulse: 82 82 83 83  Resp: (!) 24 (!) 23 (!) 22 (!) 22  Temp:    98.9 F (37.2 C)  TempSrc:    Oral  SpO2: 98% 100% 100% (!) 4%    Intake/Output Summary (Last 24 hours) at 05/09/2021 0849 Last data filed at 05/09/2021 1610 Gross per 24 hour  Intake 100 ml  Output --  Net 100 ml   There were no vitals filed for this visit.  Telemetry    NA - Personally Reviewed  ECG    NA - Personally Reviewed  Physical Exam   GEN: No acute distress.   Neck: No  JVD Cardiac: RRR, no murmurs, rubs, or gallops.  Respiratory: Clear to auscultation bilaterally. GI: Soft, nontender, non-distended  MS:   Mild edema; No deformity. Neuro:  Nonfocal  Psych: Normal affect   Labs    Chemistry Recent Labs  Lab 05/06/21 2335 05/07/21 0643 05/07/21 1732 05/08/21 0423 05/09/21 0003 05/09/21 0322  NA  --  142  --  138 141  --   K  --  3.5  --  4.2 3.3*  --   CL  --  108  --  102 105  --   CO2  --   27  --  26 25  --   GLUCOSE  --  114*  --  397* 114*  --   BUN  --  11  --  16 19  --   CREATININE  --  0.77   < > 1.14* 0.85 0.97  CALCIUM  --  9.2  --  9.2 9.2  --   PROT 6.7  --   --   --  6.7  --   ALBUMIN 3.5  --   --   --  3.6  --   AST 25  --   --   --  85*  --   ALT 32  --   --   --  74*  --   ALKPHOS 87  --   --   --  79  --   BILITOT 0.7  --   --   --  0.7  --   GFRNONAA  --  >60   < > 52* >60 >60  ANIONGAP  --  7  --  10 11  --    < > = values  in this interval not displayed.     Hematology Recent Labs  Lab 05/08/21 0423 05/09/21 0003 05/09/21 0322  WBC 10.8* 14.5* 15.3*  RBC 3.64* 3.70* 3.79*  HGB 10.1* 10.4* 10.5*  HCT 32.6* 32.9* 33.8*  MCV 89.6 88.9 89.2  MCH 27.7 28.1 27.7  MCHC 31.0 31.6 31.1  RDW 14.1 14.1 14.1  PLT 263 239 298    Cardiac EnzymesNo results for input(s): TROPONINI in the last 168 hours. No results for input(s): TROPIPOC in the last 168 hours.   BNP Recent Labs  Lab 05/06/21 1934 05/07/21 0643 05/08/21 0004  BNP 89.0 84.8 234.6*     DDimer No results for input(s): DDIMER in the last 168 hours.   Radiology    CARDIAC CATHETERIZATION  Result Date: 05/07/2021   Suezanne Jacquet Cx to Prox Cx lesion is 40% stenosed.   Mid LAD lesion is 30% stenosed.   Prox RCA to Mid RCA lesion is 40% stenosed. 1.  Mild to moderate nonobstructive coronary artery disease. 2.  Left ventricular angiography was not performed.  EF was normal by echo. 3.  Moderately elevated left ventricular end-diastolic pressure at 22 mmHg. Recommendations: Recommend aggressive medical therapy for nonobstructive coronary artery disease. Some of the patient's symptoms are likely related to diastolic heart failure due to uncontrolled hypertension and untreated sleep apnea. I increased the dose of Toprol and lisinopril.  I switched hydrochlorothiazide to small dose furosemide.   DG Chest Port 1 View  Result Date: 05/08/2021 CLINICAL DATA:  Shortness of breath EXAM: PORTABLE CHEST 1 VIEW  COMPARISON:  05/06/2021 FINDINGS: Cardiomegaly with interim vascular congestion and development of perihilar and basilar hazy opacity likely edema. There are probable small effusions. No pneumothorax. IMPRESSION: Cardiomegaly with vascular congestion, probable hazy perihilar and lung base edema, and suspected small effusions Electronically Signed   By: Jasmine Pang M.D.   On: 05/08/2021 22:10   ECHOCARDIOGRAM COMPLETE  Result Date: 05/07/2021    ECHOCARDIOGRAM REPORT   Patient Name:   Charlotte Stuart Seib Date of Exam: 05/07/2021 Medical Rec #:  [id number]         Height:       61.0 in Accession #:    [id number]        Weight:       240.0 lb Date of Birth:  February 22, 1951         BSA:          2.041 m Patient Age:    70 years          BP:           146/57 mmHg Patient Gender: F                 HR:           81 bpm. Exam Location:  Inpatient Procedure: 2D Echo, Cardiac Doppler and Color Doppler Indications:    R07.9* Chest pain, unspecified  History:        Patient has no prior history of Echocardiogram examinations.                 Signs/Symptoms:Chest Pain.  Sonographer:    MH Referring Phys: [id number] CARRIEL T NIPP IMPRESSIONS  1. Left ventricular ejection fraction, by estimation, is 55 to 60%. The left ventricle has normal function. The left ventricle has no regional wall motion abnormalities. There is mild concentric left ventricular hypertrophy. Left ventricular diastolic parameters are indeterminate.  2. Right ventricular systolic function is normal. The right ventricular size is  normal. There is normal pulmonary artery systolic pressure.  3. The mitral valve is normal in structure. No evidence of mitral valve regurgitation. No evidence of mitral stenosis.  4. The aortic valve is tricuspid. Aortic valve regurgitation is not visualized. No aortic stenosis is present.  5. The inferior vena cava is normal in size with greater than 50% respiratory variability, suggesting right atrial pressure of 3 mmHg. FINDINGS  Left  Ventricle: Left ventricular ejection fraction, by estimation, is 55 to 60%. The left ventricle has normal function. The left ventricle has no regional wall motion abnormalities. The left ventricular internal cavity size was normal in size. There is  mild concentric left ventricular hypertrophy. Left ventricular diastolic parameters are indeterminate. Right Ventricle: The right ventricular size is normal. No increase in right ventricular wall thickness. Right ventricular systolic function is normal. There is normal pulmonary artery systolic pressure. The tricuspid regurgitant velocity is 1.64 m/s, and  with an assumed right atrial pressure of 3 mmHg, the estimated right ventricular systolic pressure is 13.8 mmHg. Left Atrium: Left atrial size was normal in size. Right Atrium: Right atrial size was normal in size. Pericardium: There is no evidence of pericardial effusion. Mitral Valve: The mitral valve is normal in structure. There is mild thickening of the mitral valve leaflet(s). No evidence of mitral valve regurgitation. No evidence of mitral valve stenosis. Tricuspid Valve: The tricuspid valve is normal in structure. Tricuspid valve regurgitation is trivial. No evidence of tricuspid stenosis. Aortic Valve: The aortic valve is tricuspid. Aortic valve regurgitation is not visualized. No aortic stenosis is present. Aortic valve mean gradient measures 6.0 mmHg. Aortic valve peak gradient measures 8.4 mmHg. Aortic valve area, by VTI measures 2.41 cm. Pulmonic Valve: The pulmonic valve was normal in structure. Pulmonic valve regurgitation is trivial. No evidence of pulmonic stenosis. Aorta: The aortic root is normal in size and structure. Venous: The inferior vena cava is normal in size with greater than 50% respiratory variability, suggesting right atrial pressure of 3 mmHg. IAS/Shunts: No atrial level shunt detected by color flow Doppler.  LEFT VENTRICLE PLAX 2D LVIDd:         4.00 cm     Diastology LVIDs:          2.80 cm     LV e' medial:    9.57 cm/s LV PW:         1.22 cm     LV E/e' medial:  10.2 LV IVS:        1.10 cm     LV e' lateral:   7.72 cm/s LVOT diam:     1.80 cm     LV E/e' lateral: 12.6 LV SV:         90 LV SV Index:   44 LVOT Area:     2.54 cm  LV Volumes (MOD) LV vol d, MOD A4C: 35.2 ml LV vol s, MOD A4C: 13.6 ml LV SV MOD A4C:     35.2 ml RIGHT VENTRICLE RV S prime:     11.30 cm/s TAPSE (M-mode): 3.0 cm LEFT ATRIUM             Index       RIGHT ATRIUM           Index LA diam:        2.50 cm 1.22 cm/m  RA Area:     14.40 cm LA Vol (A2C):   21.2 ml 10.39 ml/m RA Volume:   33.50 ml  16.41 ml/m  LA Vol (A4C):   22.8 ml 11.17 ml/m LA Biplane Vol: 23.0 ml 11.27 ml/m  AORTIC VALVE                    PULMONIC VALVE AV Area (Vmax):    2.25 cm     PV Vmax:       0.52 m/s AV Area (Vmean):   2.08 cm     PV Peak grad:  1.1 mmHg AV Area (VTI):     2.41 cm AV Vmax:           145.00 cm/s AV Vmean:          114.000 cm/s AV VTI:            0.372 m AV Peak Grad:      8.4 mmHg AV Mean Grad:      6.0 mmHg LVOT Vmax:         128.00 cm/s LVOT Vmean:        93.100 cm/s LVOT VTI:          0.352 m LVOT/AV VTI ratio: 0.95  AORTA Ao Root diam: 3.00 cm Ao Asc diam:  3.00 cm MITRAL VALVE               TRICUSPID VALVE MV Area (PHT): 4.58 cm    TR Peak grad:   10.8 mmHg MV E velocity: 97.50 cm/s  TR Vmax:        164.00 cm/s MV A velocity: 86.30 cm/s MV E/A ratio:  1.13        SHUNTS                            Systemic VTI:  0.35 m                            Systemic Diam: 1.80 cm Chilton Si MD Electronically signed by Chilton Si MD Signature Date/Time: 05/07/2021/2:28:13 PM    Final     Cardiac Studies   CATH:      Suezanne Jacquet Cx to Prox Cx lesion is 40% stenosed.   Mid LAD lesion is 30% stenosed.   Prox RCA to Mid RCA lesion is 40% stenosed.   1.  Mild to moderate nonobstructive coronary artery disease. 2.  Left ventricular angiography was not performed.  EF was normal by echo. 3.  Moderately elevated left  ventricular end-diastolic pressure at 22 mmHg.    Patient Profile     70 y.o. female  with h/o non-obstructive CAD, just underwent LHC yesterday which showed no obstructive lesions, just discharged from [facility] less than 24h ago who is being seen 05/09/2021 for the evaluation of SOB and hypoxia.  Assessment & Plan    Hypertensive urgency:    BP is down from presentation but not at target.  I will increase the lisinopril.    Acute diastolic HF:    I/O incomplete in the ED but the patient feels much better.  No change in therapy. She was sent home with meds that seem to be controlling her BP.  She was sent home on PRN Lasix after getting Lasix in the hospital during the last visit.  I will give another dose of IV Lasix today.     Hypokalemia: Supplemented.  Likely needs PO potassium at discharge.    For questions or updates, please contact [facility] Please consult [email or web address] for contact info under  Cardiology/STEMI.   Signed, Rollene Rotunda, MD  05/09/2021, 8:49 AM

## 2021-05-09 NOTE — ED Notes (Signed)
RT at bedside.

## 2021-05-09 NOTE — ED Notes (Signed)
Cards/admitting MD called back and is sending someone to come see pt

## 2021-05-09 NOTE — Progress Notes (Signed)
Inpatient Diabetes Program Recommendations  AACE/ADA: New Consensus Statement on Inpatient Glycemic Control (2015)  Target Ranges:  Prepandial:   less than 140 mg/dL      Peak postprandial:   less than 180 mg/dL (1-2 hours)      Critically ill patients:  140 - 180 mg/dL   Lab Results  Component Value Date   GLUCAP 266 (H) 05/09/2021   HGBA1C 8.6 (H) 05/07/2021    Review of Glycemic Control  Diabetes history: DM2 Outpatient Diabetes medications: Humulin U-500 insulin 100 units in am, 50 units in pm (depending on blood sugar), Victoza 1.8 mg, recently prescribed Ozempic instead of Victoza, but has not taken Ozempic yet Current orders for Inpatient glycemic control: U-500 30 BID, Victoza 1.8 mg QD  Inpatient Diabetes Program Recommendations:    Consider adding Novolog 0-20 units TID with meals and 0-5 HS Add CHO mod med to 2 gm sodium diet  Will follow glucose trends.  Thank you. Ailene Ards, RD, LDN, CDE Inpatient Diabetes Coordinator [phone]

## 2021-05-09 NOTE — ED Notes (Signed)
Pt reports feeling more shob. O2 sat 100% on 4L. Paged cards admitting. Notified RT. Lung sounds diminished.

## 2021-05-10 ENCOUNTER — Encounter: Payer: Self-pay | Admitting: Podiatry

## 2021-05-10 DIAGNOSIS — I5031 Acute diastolic (congestive) heart failure: Secondary | ICD-10-CM

## 2021-05-10 DIAGNOSIS — J9690 Respiratory failure, unspecified, unspecified whether with hypoxia or hypercapnia: Secondary | ICD-10-CM | POA: Diagnosis present

## 2021-05-10 DIAGNOSIS — I10 Essential (primary) hypertension: Secondary | ICD-10-CM

## 2021-05-10 DIAGNOSIS — J9601 Acute respiratory failure with hypoxia: Secondary | ICD-10-CM

## 2021-05-10 DIAGNOSIS — I5030 Unspecified diastolic (congestive) heart failure: Secondary | ICD-10-CM | POA: Diagnosis present

## 2021-05-10 LAB — PROCALCITONIN: Procalcitonin: <0.1 ng/mL

## 2021-05-10 LAB — BASIC METABOLIC PANEL
Anion gap: 10 (ref 5–15)
BUN: 13 mg/dL (ref 8–23)
CO2: 28 mmol/L (ref 22–32)
Calcium: 8.8 mg/dL — ABNORMAL LOW (ref 8.9–10.3)
Chloride: 102 mmol/L (ref 98–111)
Creatinine, Ser: 0.88 mg/dL (ref 0.44–1.00)
GFR, Estimated: >60 mL/min (ref >60)
Glucose, Bld: 212 mg/dL — ABNORMAL HIGH (ref 70–99)
Potassium: 3.4 mmol/L — ABNORMAL LOW (ref 3.5–5.1)
Sodium: 140 mmol/L (ref 135–145)

## 2021-05-10 LAB — RESPIRATORY PANEL BY PCR

## 2021-05-10 LAB — GLUCOSE, CAPILLARY
Glucose-Capillary: 218 mg/dL — ABNORMAL HIGH (ref 70–99)
Glucose-Capillary: 230 mg/dL — ABNORMAL HIGH (ref 70–99)
Glucose-Capillary: 292 mg/dL — ABNORMAL HIGH (ref 70–99)
Glucose-Capillary: 274 mg/dL — ABNORMAL HIGH (ref 70–99)
Glucose-Capillary: 386 mg/dL — ABNORMAL HIGH (ref 70–99)
Glucose-Capillary: 398 mg/dL — ABNORMAL HIGH (ref 70–99)

## 2021-05-10 MED ORDER — DIPHENHYDRAMINE HCL 50 MG/ML IJ SOLN: 50.0000 mg | Freq: Once | INTRAMUSCULAR | Status: AC

## 2021-05-10 MED ORDER — INSULIN ASPART 100 UNIT/ML IJ SOLN: 0.0000 [IU] | Freq: Three times a day (TID) | INTRAMUSCULAR | Status: DC

## 2021-05-10 MED ORDER — CARVEDILOL 12.5 MG PO TABS
12.5000 mg | ORAL_TABLET | Freq: Two times a day (BID) | ORAL | Status: DC
  Administered 2021-05-10 – 2021-05-12 (×5): 12.5 mg via ORAL
  Filled 2021-05-10 (×5): qty 1

## 2021-05-10 MED ORDER — POTASSIUM CHLORIDE CRYS ER 20 MEQ PO TBCR
40.0000 meq | EXTENDED_RELEASE_TABLET | Freq: Once | ORAL | Status: AC
  Administered 2021-05-10: 40 meq via ORAL
  Filled 2021-05-10: qty 2

## 2021-05-10 MED ORDER — POTASSIUM CHLORIDE CRYS ER 20 MEQ PO TBCR
40.0000 meq | EXTENDED_RELEASE_TABLET | Freq: Two times a day (BID) | ORAL | Status: DC
  Administered 2021-05-10 – 2021-05-11 (×3): 40 meq via ORAL
  Filled 2021-05-10 (×3): qty 2

## 2021-05-10 MED ORDER — INSULIN ASPART 100 UNIT/ML IJ SOLN
0.0000 [IU] | Freq: Three times a day (TID) | INTRAMUSCULAR | Status: DC
  Administered 2021-05-10: 15 [IU] via SUBCUTANEOUS
  Administered 2021-05-11: 11 [IU] via SUBCUTANEOUS
  Administered 2021-05-11: 15 [IU] via SUBCUTANEOUS
  Administered 2021-05-11: 3 [IU] via SUBCUTANEOUS

## 2021-05-10 MED ORDER — PREDNISONE 20 MG PO TABS
50.0000 mg | ORAL_TABLET | Freq: Four times a day (QID) | ORAL | Status: AC
  Administered 2021-05-10 (×3): 50 mg via ORAL
  Filled 2021-05-10 (×3): qty 2

## 2021-05-10 MED ORDER — INSULIN ASPART 100 UNIT/ML IJ SOLN
0.0000 [IU] | Freq: Every day | INTRAMUSCULAR | Status: DC
  Administered 2021-05-10: 5 [IU] via SUBCUTANEOUS
  Administered 2021-05-11: 4 [IU] via SUBCUTANEOUS

## 2021-05-10 MED ORDER — EMPAGLIFLOZIN 10 MG PO TABS
10.0000 mg | ORAL_TABLET | Freq: Every day | ORAL | Status: DC
  Administered 2021-05-10 – 2021-05-12 (×3): 10 mg via ORAL
  Filled 2021-05-10 (×3): qty 1

## 2021-05-10 MED ORDER — FUROSEMIDE 10 MG/ML IJ SOLN
80.0000 mg | Freq: Two times a day (BID) | INTRAMUSCULAR | Status: DC
  Administered 2021-05-10 (×2): 80 mg via INTRAVENOUS
  Filled 2021-05-10 (×2): qty 8

## 2021-05-10 MED ORDER — INSULIN ASPART 100 UNIT/ML IJ SOLN
15.0000 [IU] | Freq: Once | INTRAMUSCULAR | Status: AC
  Administered 2021-05-10: 15 [IU] via SUBCUTANEOUS

## 2021-05-10 MED ORDER — SPIRONOLACTONE 25 MG PO TABS
25.0000 mg | ORAL_TABLET | Freq: Every day | ORAL | Status: DC
  Administered 2021-05-10 – 2021-05-12 (×3): 25 mg via ORAL
  Filled 2021-05-10 (×3): qty 1

## 2021-05-10 MED ORDER — DIPHENHYDRAMINE HCL 25 MG PO CAPS
50.0000 mg | ORAL_CAPSULE | Freq: Once | ORAL | Status: AC
  Administered 2021-05-10: 50 mg via ORAL
  Filled 2021-05-10: qty 2

## 2021-05-10 MED ORDER — MAGNESIUM HYDROXIDE 400 MG/5ML PO SUSP
30.0000 mL | Freq: Every day | ORAL | Status: DC | PRN
  Administered 2021-05-10 – 2021-05-12 (×2): 30 mL via ORAL
  Filled 2021-05-10 (×2): qty 30

## 2021-05-10 NOTE — Progress Notes (Signed)
Pt blood sugar 398 with minimal po intake pt is on contrast allergy precaution steroids. Pt is complaining of blurred vision in natural eye concerned that her blood sugar regiment has been changed. She got 5 unit of novolog insulin per night time coverage. Dr Molli Hazard called and ordered a one tine additional dose of 15 units of novolog. MD did not want to change other diabetic med's at this time

## 2021-05-10 NOTE — Progress Notes (Addendum)
Cardiology Progress Note  Patient ID: Charlotte Stuart MRN: [id number] DOB: 1950/09/11 Date of Encounter: 05/10/2021  Primary Cardiologist: Donato Schultz, MD  Subjective   Chief Complaint: Shortness of breath  HPI: Net -1 L yesterday.  Remains volume overloaded.  Elevated white count noted.  She reports cough this morning.  She reports fevers and chills at home.  Needs a better infectious work-up.  ROS:  All other ROS reviewed and negative. Pertinent positives noted in the HPI.     Inpatient Medications  Scheduled Meds:  aspirin EC  81 mg Oral Daily   carvedilol  12.5 mg Oral BID WC   enoxaparin (LOVENOX) injection  40 mg Subcutaneous Q24H   furosemide  80 mg Intravenous BID   insulin regular human CONCENTRATED  30 Units Subcutaneous BID WC   linaclotide  145 mcg Oral Daily   liraglutide  1.8 mg Subcutaneous Daily   lisinopril  40 mg Oral Daily   pantoprazole  40 mg Oral Daily   potassium chloride  40 mEq Oral BID   rosuvastatin  20 mg Oral Daily   sodium chloride flush  3 mL Intravenous Q12H   spironolactone  25 mg Oral Daily   Continuous Infusions:  sodium chloride     PRN Meds: sodium chloride, acetaminophen, albuterol, magnesium hydroxide, ondansetron (ZOFRAN) IV, sodium chloride flush   Vital Signs   Vitals:   05/09/21 2029 05/09/21 2345 05/10/21 0318 05/10/21 0818  BP: 131/63 127/60 (!) 124/56 120/69  Pulse: 89 81 93 88  Resp: 20 20 20 20   Temp: 99.6 F (37.6 C) 98.6 F (37 C) 99.8 F (37.7 C) 99.3 F (37.4 C)  TempSrc: Oral Oral Oral Oral  SpO2: 99% 100% 98% 99%  Weight:   117.9 kg     Intake/Output Summary (Last 24 hours) at 05/10/2021 1044 Last data filed at 05/10/2021 0819 Gross per 24 hour  Intake --  Output 1450 ml  Net -1450 ml   Last 3 Weights 05/10/2021 05/08/2021 05/07/2021  Weight (lbs) 259 lb 14.8 oz 256 lb 256 lb 9.6 oz  Weight (kg) 117.9 kg 116.121 kg 116.393 kg      Telemetry  Overnight telemetry shows sinus tachycardia low 100s,  which I personally reviewed.   ECG  The most recent ECG shows sinus rhythm heart rate 89, low voltage, which I personally reviewed.   Physical Exam   Vitals:   05/09/21 2029 05/09/21 2345 05/10/21 0318 05/10/21 0818  BP: 131/63 127/60 (!) 124/56 120/69  Pulse: 89 81 93 88  Resp: 20 20 20 20   Temp: 99.6 F (37.6 C) 98.6 F (37 C) 99.8 F (37.7 C) 99.3 F (37.4 C)  TempSrc: Oral Oral Oral Oral  SpO2: 99% 100% 98% 99%  Weight:   117.9 kg     Intake/Output Summary (Last 24 hours) at 05/10/2021 1044 Last data filed at 05/10/2021 0819 Gross per 24 hour  Intake --  Output 1450 ml  Net -1450 ml    Last 3 Weights 05/10/2021 05/08/2021 05/07/2021  Weight (lbs) 259 lb 14.8 oz 256 lb 256 lb 9.6 oz  Weight (kg) 117.9 kg 116.121 kg 116.393 kg    Body mass index is 49.11 kg/m.   General: Ill-appearing Head: Atraumatic, normal size  Eyes: PEERLA, EOMI  Neck: Supple, JVD 12 to 15 cm of water Endocrine: No thryomegaly Cardiac: Normal S1, S2; RRR; no murmurs, rubs, or gallops Lungs: Crackles at the lung bases Abd: Soft, nontender, no hepatomegaly  Ext: 1+ pitting edema  Musculoskeletal: No deformities, BUE and BLE strength normal and equal Skin: Warm and dry, no rashes   Neuro: Alert and oriented to person, place, time, and situation, CNII-XII grossly intact, no focal deficits  Psych: Normal mood and affect   Labs  High Sensitivity Troponin:   Recent Labs  Lab 05/06/21 1934 05/06/21 2324  TROPONINIHS 8 8     Cardiac EnzymesNo results for input(s): TROPONINI in the last 168 hours. No results for input(s): TROPIPOC in the last 168 hours.  Chemistry Recent Labs  Lab 05/06/21 2335 05/07/21 1308 05/08/21 0423 05/09/21 0003 05/09/21 0322 05/10/21 0135  NA  --    < > 138 141  --  140  K  --    < > 4.2 3.3*  --  3.4*  CL  --    < > 102 105  --  102  CO2  --    < > 26 25  --  28  GLUCOSE  --    < > 397* 114*  --  212*  BUN  --    < > 16 19  --  13  CREATININE  --    < > 1.14*  0.85 0.97 0.88  CALCIUM  --    < > 9.2 9.2  --  8.8*  PROT 6.7  --   --  6.7  --   --   ALBUMIN 3.5  --   --  3.6  --   --   AST 25  --   --  85*  --   --   ALT 32  --   --  74*  --   --   ALKPHOS 87  --   --  79  --   --   BILITOT 0.7  --   --  0.7  --   --   GFRNONAA  --    < > 52* >60 >60 >60  ANIONGAP  --    < > 10 11  --  10   < > = values in this interval not displayed.    Hematology Recent Labs  Lab 05/08/21 0423 05/09/21 0003 05/09/21 0322  WBC 10.8* 14.5* 15.3*  RBC 3.64* 3.70* 3.79*  HGB 10.1* 10.4* 10.5*  HCT 32.6* 32.9* 33.8*  MCV 89.6 88.9 89.2  MCH 27.7 28.1 27.7  MCHC 31.0 31.6 31.1  RDW 14.1 14.1 14.1  PLT 263 239 298   BNP Recent Labs  Lab 05/06/21 1934 05/07/21 0643 05/08/21 0004  BNP 89.0 84.8 234.6*    DDimer No results for input(s): DDIMER in the last 168 hours.   Radiology  DG Chest Port 1 View  Result Date: 05/08/2021 CLINICAL DATA:  Shortness of breath EXAM: PORTABLE CHEST 1 VIEW COMPARISON:  05/06/2021 FINDINGS: Cardiomegaly with interim vascular congestion and development of perihilar and basilar hazy opacity likely edema. There are probable small effusions. No pneumothorax. IMPRESSION: Cardiomegaly with vascular congestion, probable hazy perihilar and lung base edema, and suspected small effusions Electronically Signed   By: Jasmine Pang M.D.   On: 05/08/2021 22:10    Cardiac Studies  LHC 05/07/2021 Ost Cx to Prox Cx lesion is 40% stenosed.   Mid LAD lesion is 30% stenosed.   Prox RCA to Mid RCA lesion is 40% stenosed.   1.  Mild to moderate nonobstructive coronary artery disease. 2.  Left ventricular angiography was not performed.  EF was normal by echo. 3.  Moderately elevated left ventricular end-diastolic pressure at 22  mmHg.  TTE 05/07/2021  1. Left ventricular ejection fraction, by estimation, is 55 to 60%. The  left ventricle has normal function. The left ventricle has no regional  wall motion abnormalities. There is mild  concentric left ventricular  hypertrophy. Left ventricular diastolic  parameters are indeterminate.   2. Right ventricular systolic function is normal. The right ventricular  size is normal. There is normal pulmonary artery systolic pressure.   3. The mitral valve is normal in structure. No evidence of mitral valve  regurgitation. No evidence of mitral stenosis.   4. The aortic valve is tricuspid. Aortic valve regurgitation is not  visualized. No aortic stenosis is present.   5. The inferior vena cava is normal in size with greater than 50%  respiratory variability, suggesting right atrial pressure of 3 mmHg.   Patient Profile  Charlotte Stuart is a 70 y.o. female with hypertension, HFpEF, obesity, OSA who was admitted on 05/09/2021 with acute hypoxic respiratory failure.  Assessment & Plan   #Acute hypoxic respiratory failure #HFpEF #Leukocytosis #Sinus tachycardia -Underwent left heart catheterization on 05/07/2021.  This showed elevated LVEDP.  Echocardiogram recently showed normal LV function. -Readmitted with hypertensive crisis as well as acute pulmonary edema.  It appears CT PE study was never performed.  She remains hypoxic despite diuresis.  We will plan to pursue a CT PE study.  She will need pretreatment with prednisone.  I have ordered this. -She does report a fever cough and congestion.  We will order a respiratory viral panel.  I have also ordered procalcitonin's.  This will help [place] rule out any infectious source.  We will get a look at the lung parenchyma on the CT PE study. -In the interim we will increase her diuresis to 80 mg IV twice daily.  I have added Aldactone 25 mg daily to help with potassium.  She will also take potassium supplement with her diuretic.  #HTN -Continue lisinopril 40 mg daily, Coreg 12.5 mg twice daily.  We have added Aldactone 25 mg daily.  #Nonobstructive CAD -Aspirin and statin.  #DM -home insulin regimen   FEN -no IVF -diet: heart  healthy -code: full -dvt ppx: lovenox   For questions or updates, please contact [facility] Please consult [email or web address] for contact info under   Time Spent with Patient: I have spent a total of 35 minutes with patient reviewing hospital notes, telemetry, EKGs, labs and examining the patient as well as establishing an assessment and plan that was discussed with the patient.  > 50% of time was spent in direct patient care.    Signed, Lenna Gilford. Flora Lipps, MD, [facility] [place]  [facility]  05/10/2021 10:44 AM

## 2021-05-10 NOTE — Progress Notes (Signed)
  Subjective:  Patient ID: Charlotte Stuart, female    DOB: 09-14-1950,  MRN: [id number]  70 y.o. female presents with at risk foot care with history of diabetic neuropathy and painful thick toenails that are difficult to trim. Pain interferes with ambulation. Aggravating factors include wearing enclosed shoe gear. Pain is relieved with periodic professional debridement..    Patient's blood sugar was 137 mg/dl today.    She is requesting diabetic shoes on today's visit.  PCP: Cheron Schaumann., MD and last visit was: 06/27/2020.  Review of Systems: Negative except as noted in the HPI.   Allergies  Allergen Reactions   Iodinated Diagnostic Agents Itching and Swelling    Burning, swelling burns    Objective:  There were no vitals filed for this visit. Constitutional Patient is a pleasant 70 y.o. African American female morbidly obese in NAD. AAO x 3.  Vascular Capillary fill time to digits immediate b/l.  DP/PT pulse(s) are palpable b/l lower extremities. Lower extremity skin temperature gradient within normal limits. No pain with calf compression b/l lower extremities. No cyanosis or clubbing noted. Pedal hair sparse. Trace edema noted b/l lower extremities.  Neurologic No clonus b/l. Protective sensation diminished with 10g monofilament b/l.  Dermatologic Pedal skin is warm and supple b/l.  No open wounds b/l lower extremities. No interdigital macerations b/l lower extremities. Toenails 1-5 b/l elongated, discolored, dystrophic, thickened, crumbly with subungual debris and tenderness to dorsal palpation.   Orthopedic: Normal muscle strength 5/5 to all lower extremity muscle groups bilaterally. Patient ambulates independent of any assistive aids. No pain crepitus or joint limitation noted with ROM b/l lower extremities. No gross bony deformities b/l lower extremities.   Hemoglobin A1C Latest Ref Rng & Units 05/07/2021  HGBA1C 4.8 - 5.6 % 8.6(H)  Some recent data might be hidden    Assessment:   1. Pain due to onychomycosis of toenail   2. Diabetic peripheral neuropathy associated with type 2 diabetes mellitus (HCC)    Plan:  Patient was evaluated and treated and all questions answered. Consent given for treatment as described below: -Examined patient. -Continue diabetic foot care principles: inspect feet daily, monitor glucose as recommended by PCP and/or Endocrinologist, and follow prescribed diet per PCP, Endocrinologist and/or dietician. -Patient to continue soft, supportive shoe gear daily. Start procedure for diabetic shoes. Patient qualifies based on diagnoses. -Toenails 1-5 b/l were debrided in length and girth with sterile nail nippers and dremel without iatrogenic bleeding.  -Patient to report any pedal injuries to medical professional immediately. -Patient/POA to call should there be question/concern in the interim.  Return in about 3 months (around 08/05/2021).  Freddie Breech, DPM

## 2021-05-11 ENCOUNTER — Inpatient Hospital Stay (HOSPITAL_COMMUNITY): Payer: Medicare HMO

## 2021-05-11 DIAGNOSIS — J9601 Acute respiratory failure with hypoxia: Secondary | ICD-10-CM | POA: Diagnosis not present

## 2021-05-11 LAB — GLUCOSE, CAPILLARY
Glucose-Capillary: 172 mg/dL — ABNORMAL HIGH (ref 70–99)
Glucose-Capillary: 340 mg/dL — ABNORMAL HIGH (ref 70–99)
Glucose-Capillary: 383 mg/dL — ABNORMAL HIGH (ref 70–99)
Glucose-Capillary: 336 mg/dL — ABNORMAL HIGH (ref 70–99)

## 2021-05-11 LAB — BASIC METABOLIC PANEL
Anion gap: 12 (ref 5–15)
BUN: 20 mg/dL (ref 8–23)
CO2: 31 mmol/L (ref 22–32)
Calcium: 9.7 mg/dL (ref 8.9–10.3)
Chloride: 97 mmol/L — ABNORMAL LOW (ref 98–111)
Creatinine, Ser: 1.04 mg/dL — ABNORMAL HIGH (ref 0.44–1.00)
GFR, Estimated: 58 mL/min — ABNORMAL LOW (ref >60)
Glucose, Bld: 225 mg/dL — ABNORMAL HIGH (ref 70–99)
Potassium: 3.8 mmol/L (ref 3.5–5.1)
Sodium: 140 mmol/L (ref 135–145)

## 2021-05-11 LAB — PROCALCITONIN: Procalcitonin: <0.1 ng/mL

## 2021-05-11 MED ORDER — FUROSEMIDE 10 MG/ML IJ SOLN: 80.0000 mg | Freq: Two times a day (BID) | INTRAMUSCULAR | Status: DC

## 2021-05-11 MED ORDER — TORSEMIDE 20 MG PO TABS
20.0000 mg | ORAL_TABLET | Freq: Every day | ORAL | Status: DC
  Administered 2021-05-12: 20 mg via ORAL
  Filled 2021-05-11: qty 1

## 2021-05-11 MED ORDER — POTASSIUM CHLORIDE CRYS ER 20 MEQ PO TBCR: 40.0000 meq | EXTENDED_RELEASE_TABLET | Freq: Two times a day (BID) | ORAL | Status: DC

## 2021-05-11 MED ORDER — FUROSEMIDE 10 MG/ML IJ SOLN
80.0000 mg | Freq: Once | INTRAMUSCULAR | Status: AC
  Administered 2021-05-11: 80 mg via INTRAVENOUS
  Filled 2021-05-11: qty 8

## 2021-05-11 MED ORDER — POTASSIUM CHLORIDE CRYS ER 20 MEQ PO TBCR
40.0000 meq | EXTENDED_RELEASE_TABLET | Freq: Once | ORAL | Status: AC
  Administered 2021-05-11: 40 meq via ORAL
  Filled 2021-05-11: qty 2

## 2021-05-11 MED ORDER — ALPRAZOLAM 0.5 MG PO TABS
0.5000 mg | ORAL_TABLET | Freq: Once | ORAL | Status: AC
  Administered 2021-05-11: 0.5 mg via ORAL
  Filled 2021-05-11: qty 1

## 2021-05-11 MED ORDER — IOHEXOL 350 MG/ML SOLN
75.0000 mL | Freq: Once | INTRAVENOUS | Status: AC | PRN
  Administered 2021-05-11: 75 mL via INTRAVENOUS

## 2021-05-11 NOTE — Progress Notes (Signed)
Mobility Specialist Progress Note    05/11/21 1631  Mobility  Activity Ambulated in hall  Level of Assistance Independent  Distance Ambulated (ft) 370 ft  Mobility Ambulated independently in hallway  Mobility Response Tolerated well  Mobility performed by Mobility specialist  $Mobility charge 1 Mobility   Pre-Mobility: 75 HR, 124/45 BP  Pt found in chair and agreeable to ambulation. Took x2 short standing breaks b/c of legs aching from high blood sugar but otherwise asx with better control of RR. Returned to BR so could continue washing up.   [place] Mobility Specialist  Mobility Specialist Phone: [phone]

## 2021-05-11 NOTE — Progress Notes (Signed)
Mobility Specialist Progress Note    05/11/21 1212  Mobility  Activity Ambulated in hall  Level of Assistance Standby assist, set-up cues, supervision of patient - no hands on  Assistive Device None  Distance Ambulated (ft) 370 ft  Mobility Ambulated independently in hallway  Mobility Response Tolerated fair  Mobility performed by Mobility specialist  Bed Position Chair  $Mobility charge 1 Mobility   Pre-Mobility: 82 HR, 148/63 BP, 96% SpO2 Post-Mobility: 85 HR, 149/94 BP  Pt received in bed and agreeable to walk. Pt c/o SOB during and took x4 standing breaks to catch breath. Pt also noted legs were aching b/c of sugar levels. Upon return had successful void in BR and left in chair with call bell in reach.   [place] Mobility Specialist  Mobility Specialist Phone: [phone]

## 2021-05-11 NOTE — Progress Notes (Signed)
Patient education documents given to patient on cooking with less salt, heart healthy/low sodium diet. Michah Minton, Randall An RN

## 2021-05-11 NOTE — Progress Notes (Signed)
Cardiology Progress Note  Patient ID: MELENI DELAHUNT MRN: [id number] DOB: 1950/10/22 Date of Encounter: 05/11/2021  Primary Cardiologist: Donato Schultz, MD  Subjective   Chief Complaint: Shortness of breath  HPI: Good diuresis yesterday.  Still short of breath.  Crackles at lung bases.  Anticipate 1 more dose of IV Lasix.  She has concerns about going home as she came back quickly to the hospital with shortness of breath  ROS:  All other ROS reviewed and negative. Pertinent positives noted in the HPI.     Inpatient Medications  Scheduled Meds:  aspirin EC  81 mg Oral Daily   carvedilol  12.5 mg Oral BID WC   empagliflozin  10 mg Oral Daily   enoxaparin (LOVENOX) injection  40 mg Subcutaneous Q24H   furosemide  80 mg Intravenous Once   insulin aspart  0-15 Units Subcutaneous TID WC   insulin aspart  0-5 Units Subcutaneous QHS   insulin regular human CONCENTRATED  30 Units Subcutaneous BID WC   linaclotide  145 mcg Oral Daily   liraglutide  1.8 mg Subcutaneous Daily   lisinopril  40 mg Oral Daily   pantoprazole  40 mg Oral Daily   potassium chloride  40 mEq Oral Once   rosuvastatin  20 mg Oral Daily   sodium chloride flush  3 mL Intravenous Q12H   spironolactone  25 mg Oral Daily   [START ON 05/12/2021] torsemide  20 mg Oral Daily   Continuous Infusions:  sodium chloride     PRN Meds: sodium chloride, acetaminophen, albuterol, magnesium hydroxide, ondansetron (ZOFRAN) IV, sodium chloride flush   Vital Signs   Vitals:   05/10/21 1945 05/11/21 0313 05/11/21 0630 05/11/21 0731  BP: (!) 119/48 117/65  (!) 133/92  Pulse: 81 65  73  Resp: 20 19  18   Temp: 98 F (36.7 C) 98.5 F (36.9 C)  98.1 F (36.7 C)  TempSrc: Oral Oral  Oral  SpO2: 100% 98%  99%  Weight:   (P) 111.7 kg     Intake/Output Summary (Last 24 hours) at 05/11/2021 0908 Last data filed at 05/11/2021 0800 Gross per 24 hour  Intake 720 ml  Output 4650 ml  Net -3930 ml   Last 3 Weights 05/11/2021  05/10/2021 05/08/2021  Weight (lbs) 246 lb 3.2 oz 259 lb 14.8 oz 256 lb  Weight (kg) 111.676 kg 117.9 kg 116.121 kg      Telemetry  Overnight telemetry shows sinus rhythm 80, which I personally reviewed.   Physical Exam   Vitals:   05/10/21 1945 05/11/21 0313 05/11/21 0630 05/11/21 0731  BP: (!) 119/48 117/65  (!) 133/92  Pulse: 81 65  73  Resp: 20 19  18   Temp: 98 F (36.7 C) 98.5 F (36.9 C)  98.1 F (36.7 C)  TempSrc: Oral Oral  Oral  SpO2: 100% 98%  99%  Weight:   (P) 111.7 kg     Intake/Output Summary (Last 24 hours) at 05/11/2021 0908 Last data filed at 05/11/2021 0800 Gross per 24 hour  Intake 720 ml  Output 4650 ml  Net -3930 ml    Last 3 Weights 05/11/2021 05/10/2021 05/08/2021  Weight (lbs) 246 lb 3.2 oz 259 lb 14.8 oz 256 lb  Weight (kg) 111.676 kg 117.9 kg 116.121 kg    Body mass index is 46.52 kg/m (pended).   General: Well nourished, well developed, in no acute distress Head: Atraumatic, normal size  Eyes: PEERLA, EOMI  Neck: Supple, JVD 8 to 10  cm of water Endocrine: No thryomegaly Cardiac: Normal S1, S2; RRR; no murmurs, rubs, or gallops Lungs: Crackles at the lung bases Abd: Soft, nontender, no hepatomegaly  Ext: Trace edema Musculoskeletal: No deformities, BUE and BLE strength normal and equal Skin: Warm and dry, no rashes   Neuro: Alert and oriented to person, place, time, and situation, CNII-XII grossly intact, no focal deficits  Psych: Normal mood and affect   Labs  High Sensitivity Troponin:   Recent Labs  Lab 05/06/21 1934 05/06/21 2324  TROPONINIHS 8 8     Cardiac EnzymesNo results for input(s): TROPONINI in the last 168 hours. No results for input(s): TROPIPOC in the last 168 hours.  Chemistry Recent Labs  Lab 05/06/21 2335 05/07/21 0258 05/09/21 0003 05/09/21 0322 05/10/21 0135 05/11/21 0117  NA  --    < > 141  --  140 140  K  --    < > 3.3*  --  3.4* 3.8  CL  --    < > 105  --  102 97*  CO2  --    < > 25  --  28 31  GLUCOSE   --    < > 114*  --  212* 225*  BUN  --    < > 19  --  13 20  CREATININE  --    < > 0.85 0.97 0.88 1.04*  CALCIUM  --    < > 9.2  --  8.8* 9.7  PROT 6.7  --  6.7  --   --   --   ALBUMIN 3.5  --  3.6  --   --   --   AST 25  --  85*  --   --   --   ALT 32  --  74*  --   --   --   ALKPHOS 87  --  79  --   --   --   BILITOT 0.7  --  0.7  --   --   --   GFRNONAA  --    < > >60 >60 >60 58*  ANIONGAP  --    < > 11  --  10 12   < > = values in this interval not displayed.    Hematology Recent Labs  Lab 05/08/21 0423 05/09/21 0003 05/09/21 0322  WBC 10.8* 14.5* 15.3*  RBC 3.64* 3.70* 3.79*  HGB 10.1* 10.4* 10.5*  HCT 32.6* 32.9* 33.8*  MCV 89.6 88.9 89.2  MCH 27.7 28.1 27.7  MCHC 31.0 31.6 31.1  RDW 14.1 14.1 14.1  PLT 263 239 298   BNP Recent Labs  Lab 05/06/21 1934 05/07/21 0643 05/08/21 0004  BNP 89.0 84.8 234.6*    DDimer No results for input(s): DDIMER in the last 168 hours.   Radiology  CT Angio Chest Pulmonary Embolism (PE) W or WO Contrast  Result Date: 05/11/2021 CLINICAL DATA:  Chest pain or shortness of breath, pleurisy or effusion suspected EXAM: CT ANGIOGRAPHY CHEST WITH CONTRAST TECHNIQUE: Multidetector CT imaging of the chest was performed using the standard protocol during bolus administration of intravenous contrast. Multiplanar CT image reconstructions and MIPs were obtained to evaluate the vascular anatomy. CONTRAST:  65mL OMNIPAQUE IOHEXOL 350 MG/ML SOLN COMPARISON:  04/08/2017 CT chest, correlation is also made with 05/08/2021 chest radiograph. FINDINGS: Cardiovascular: Satisfactory opacification of the pulmonary arteries to the segmental level. No evidence of pulmonary embolism. Normal heart size. No pericardial effusion. Mild aortic atherosclerotic calcifications. Coronary artery calcifications.  Mediastinum/Nodes: No enlarged mediastinal, hilar, or axillary lymph nodes. Thyroid gland, trachea, and esophagus demonstrate no significant findings. Lungs/Pleura:  Small right-greater-than-left pleural effusions with associated atelectasis. Interlobular septal thickening, likely pulmonary edema. Upper Abdomen: No acute abnormality. Musculoskeletal: No acute osseous abnormality. Review of the MIP images confirms the above findings. IMPRESSION: 1. Small, right-greater-than-left pleural effusions, with associated atelectasis, and interlobular septal thickening, likely pulmonary edema. Correlate with BNP and symptoms of heart failure. 2. Negative for pulmonary embolism to the level of the segmental arteries. Aortic Atherosclerosis (ICD10-I70.0). Electronically Signed   By: Wiliam Ke M.D.   On: 05/11/2021 00:33    Cardiac Studies  LHC 05/07/2021   Ost Cx to Prox Cx lesion is 40% stenosed.   Mid LAD lesion is 30% stenosed.   Prox RCA to Mid RCA lesion is 40% stenosed.   1.  Mild to moderate nonobstructive coronary artery disease. 2.  Left ventricular angiography was not performed.  EF was normal by echo. 3.  Moderately elevated left ventricular end-diastolic pressure at 22 mmHg.   Recommendations: Recommend aggressive medical therapy for nonobstructive coronary artery disease. Some of the patient's symptoms are likely related to diastolic heart failure due to uncontrolled hypertension and untreated sleep apnea. I increased the dose of Toprol and lisinopril.  I switched hydrochlorothiazide to small dose furosemide.  TTE 05/07/2021  1. Left ventricular ejection fraction, by estimation, is 55 to 60%. The  left ventricle has normal function. The left ventricle has no regional  wall motion abnormalities. There is mild concentric left ventricular  hypertrophy. Left ventricular diastolic  parameters are indeterminate.   2. Right ventricular systolic function is normal. The right ventricular  size is normal. There is normal pulmonary artery systolic pressure.   3. The mitral valve is normal in structure. No evidence of mitral valve  regurgitation. No evidence of  mitral stenosis.   4. The aortic valve is tricuspid. Aortic valve regurgitation is not  visualized. No aortic stenosis is present.   5. The inferior vena cava is normal in size with greater than 50%  respiratory variability, suggesting right atrial pressure of 3 mmHg.    Patient Profile  PARALEE PENDERGRASS is a 70 y.o. female with hypertension, HFpEF, obesity, OSA who was admitted on 05/09/2021 with acute hypoxic respiratory failure.  Assessment & Plan   #Acute hypoxic respiratory failure #HFpEF #Leukocytosis #Sinus tachycardia -CT PE study negative.  Respiratory viral panel negative.  Procalcitonin negative.  No signs of pneumonia. -Chest CT shows persistent pulmonary edema. -Net -4.3 L overnight.  -5.1 L since admission.  Weight is down 6 kg. -Still volume overloaded.  Crackles on lung bases.  1 more dose of IV Lasix 80 mg. -Anticipate transition to torsemide tomorrow. -Continue blood pressure control.  Counseled extensively on salt reductive strategies. -jardiance added  #HTN -Continue Coreg, lisinopril.  Aldactone added for diuresis.  #Nonobstructive CAD -Aspirin and statin.  #Diabetes -Home insulin regimen.  FEN -no IVF -code: full -diet: heart healthy -dvt ppx: lovenox  For questions or updates, please contact [facility] Please consult [email or web address] for contact info under   Time Spent with Patient: I have spent a total of 35 minutes with patient reviewing hospital notes, telemetry, EKGs, labs and examining the patient as well as establishing an assessment and plan that was discussed with the patient.  > 50% of time was spent in direct patient care.    Signed, Lenna Gilford. Flora Lipps, MD, [facility] [place]  [facility]  05/11/2021 9:08 AM

## 2021-05-12 ENCOUNTER — Other Ambulatory Visit (HOSPITAL_COMMUNITY): Payer: Self-pay

## 2021-05-12 ENCOUNTER — Encounter (HOSPITAL_COMMUNITY): Payer: Self-pay | Admitting: Cardiology

## 2021-05-12 DIAGNOSIS — J810 Acute pulmonary edema: Secondary | ICD-10-CM | POA: Diagnosis not present

## 2021-05-12 DIAGNOSIS — I10 Essential (primary) hypertension: Secondary | ICD-10-CM | POA: Diagnosis not present

## 2021-05-12 DIAGNOSIS — J9601 Acute respiratory failure with hypoxia: Secondary | ICD-10-CM | POA: Diagnosis not present

## 2021-05-12 DIAGNOSIS — I5031 Acute diastolic (congestive) heart failure: Secondary | ICD-10-CM | POA: Diagnosis not present

## 2021-05-12 LAB — BASIC METABOLIC PANEL
Anion gap: 12 (ref 5–15)
BUN: 34 mg/dL — ABNORMAL HIGH (ref 8–23)
CO2: 29 mmol/L (ref 22–32)
Calcium: 9.7 mg/dL (ref 8.9–10.3)
Chloride: 97 mmol/L — ABNORMAL LOW (ref 98–111)
Creatinine, Ser: 1.22 mg/dL — ABNORMAL HIGH (ref 0.44–1.00)
GFR, Estimated: 48 mL/min — ABNORMAL LOW (ref >60)
Glucose, Bld: 117 mg/dL — ABNORMAL HIGH (ref 70–99)
Potassium: 3.6 mmol/L (ref 3.5–5.1)
Sodium: 138 mmol/L (ref 135–145)

## 2021-05-12 LAB — GLUCOSE, CAPILLARY
Glucose-Capillary: 110 mg/dL — ABNORMAL HIGH (ref 70–99)
Glucose-Capillary: 232 mg/dL — ABNORMAL HIGH (ref 70–99)

## 2021-05-12 LAB — PROCALCITONIN: Procalcitonin: 1.65 ng/mL

## 2021-05-12 MED ORDER — TORSEMIDE 20 MG PO TABS
20.0000 mg | ORAL_TABLET | Freq: Every day | ORAL | 6 refills | Status: AC
  Filled 2021-05-12: qty 30, 30d supply, fill #0

## 2021-05-12 MED ORDER — LORAZEPAM 0.5 MG PO TABS
0.5000 mg | ORAL_TABLET | Freq: Once | ORAL | Status: AC
  Administered 2021-05-12: 0.5 mg via ORAL
  Filled 2021-05-12: qty 1

## 2021-05-12 MED ORDER — LISINOPRIL 40 MG PO TABS
40.0000 mg | ORAL_TABLET | Freq: Every day | ORAL | 6 refills | Status: AC
  Filled 2021-05-12: qty 30, 30d supply, fill #0

## 2021-05-12 MED ORDER — CARVEDILOL 12.5 MG PO TABS
12.5000 mg | ORAL_TABLET | Freq: Two times a day (BID) | ORAL | 6 refills | Status: AC
  Filled 2021-05-12: qty 60, 30d supply, fill #0

## 2021-05-12 MED ORDER — EMPAGLIFLOZIN 10 MG PO TABS
10.0000 mg | ORAL_TABLET | Freq: Every day | ORAL | 6 refills | Status: DC
  Filled 2021-05-12: qty 30, 30d supply, fill #0

## 2021-05-12 MED ORDER — SPIRONOLACTONE 25 MG PO TABS
25.0000 mg | ORAL_TABLET | Freq: Every day | ORAL | 6 refills | Status: AC
  Filled 2021-05-12: qty 30, 30d supply, fill #0

## 2021-05-12 NOTE — Progress Notes (Signed)
Mobility Specialist Progress Note    05/12/21 1109  Mobility  Activity Ambulated in hall  Level of Assistance Independent  Assistive Device None  Distance Ambulated (ft) 370 ft  Mobility Ambulated independently in hallway  Mobility Response Tolerated well  Mobility performed by Mobility specialist  $Mobility charge 1 Mobility   Pre-Mobility: 77 HR, 126/66 BP Post-Mobility: 97 HR  Pt found in bed and agreeable to walk. Took x3 short standing breaks to flex ankles and wrist as pt c/o soreness from previous swelling. Returned to bed with call bell in reach.   [place] Mobility Specialist  Mobility Specialist Phone: [phone]

## 2021-05-12 NOTE — Discharge Instructions (Addendum)
Heart Healthy low salt Diabetic diet.   If you have scales, weigh daily and keep a log to take to follow up visit. Weigh each morning after going to bathroom.  The first appt is 05/20/21 and it is in heart failure clinic for one time, you will park at [facility] entrance C and parking code is [id number],   time at 10 AM come 15 min early to check in.    We adjusted your medications please follow discharge list. Torsemide is taking the place of Lasix or furosemide.   We made you two appt one each week.  Please keep appts.

## 2021-05-12 NOTE — Progress Notes (Signed)
Heart Failure Nurse Navigator Progress Note  PCP: Cheron Schaumann., MD PCP-Cardiologist: Michele Rockers., MD Admission Diagnosis: flash pulm edema Admitted from: home with daughter  Presentation:   Charlotte Stuart presented 9/17 with SOB. Pt went home for less than 6 hours before having to return to hospital for flash pulmonary edema related to HTN urgency. Pt interactive with interview process. Anxious, very concerned about having to return to the hospital again. Pt states she will work on sodium and fluid intake restrictions. TOC pharmacy to bring medications to bedside. Pt lives with adult daughter, does not have many bill responsibilities. Pt is able to get medications without issues of cost and takes medications as prescribed. Pt does not drive, daughter will bring to appointments, mornings work best. Jovita Gamma information regarding [employer] Pt again states she is nervous about having to return. Gave direct instructions to take medications as prescribed, monitor and log daily weights, and watch sodium/fluid intake. Gave information of scheduled HV TOC appt.   ECHO/ LVEF: 55-60%, LV hypertrophy.  Clinical Course:  Past Medical History:  Diagnosis Date   CAD (coronary artery disease)    Diabetes mellitus without complication (HCC)    GERD (gastroesophageal reflux disease)    Heart murmur    Hypercalcemia    Hyperlipidemia    Hypertension    Retinal detachment    right   Sensorineural hearing loss (SNHL) of right ear with unrestricted hearing of left ear    Vitamin D deficiency      Social History   Socioeconomic History   Marital status: Single    Spouse name: Not on file   Number of children: 2   Years of education: Not on file   Highest education level: Associate degree: occupational, Scientist, product/process development, or vocational program  Occupational History   Occupation: disability  Tobacco Use   Smoking status: Never   Smokeless tobacco: Former    Types: Snuff    Quit  date: 12/2020   Tobacco comments:    Snuff x 30 years  Vaping Use   Vaping Use: Never used  Substance and Sexual Activity   Alcohol use: Not Currently   Drug use: Never   Sexual activity: Not on file  Other Topics Concern   Not on file  Social History Narrative   Not on file   Social Determinants of Health   Financial Resource Strain: Low Risk    Difficulty of Paying Living Expenses: Not hard at all  Food Insecurity: No Food Insecurity   Worried About Programme researcher, broadcasting/film/video in the Last Year: Never true   Ran Out of Food in the Last Year: Never true  Transportation Needs: No Transportation Needs   Lack of Transportation (Medical): No   Lack of Transportation (Non-Medical): No  Physical Activity: Not on file  Stress: Not on file  Social Connections: Not on file    High Risk Criteria for Readmission and/or Poor Patient Outcomes: Heart failure hospital admissions (last 6 months): 2  No Show rate: 22% Difficult social situation: no Demonstrates medication adherence: yes Primary Language: English Literacy level: able to read/write and comprehend. Wears glasses.  Education Assessment and Provision:  Detailed education and instructions provided on heart failure disease management including the following:  Signs and symptoms of Heart Failure When to call the physician Importance of daily weights Low sodium diet Fluid restriction Medication management Anticipated future follow-up appointments  Patient education given on each of the above topics.  Patient acknowledges understanding via teach  back method and acceptance of all instructions.  Education Materials:  "Living Better With Heart Failure" Booklet, HF zone tool, & Daily Weight Tracker Tool.  Patient has scale at home: yes Patient has pill box at home: yes   Barriers of Care:   -none  Considerations/Referrals:   Referral made to Heart Failure Pharmacist Stewardship: no Referral made to Heart Failure CSW/NCM TOC:  no Referral made to Heart & Vascular TOC clinic: yes, 9/27 @ 10AM  Items for Follow-up on DC/TOC: -optimize   Ozella R[place], MSN, RN Heart Failure Nurse Navigator [phone]

## 2021-05-12 NOTE — TOC Initial Note (Signed)
Transition of Care [facility]) - Initial/Assessment Note    Patient Details  Name: Charlotte Stuart MRN: [id number] Date of Birth: 23-Feb-1951  [facility] CM/SW Contact:    Erenest Rasher, RN Phone Number: [phone]  05/12/2021, 9:57 AM  Clinical Narrative:                 HF TOC CM spoke to pt and states she lives in home with dtr. States she has cane. Able to afford medications. Pt requested information on how to get a reacher. Explained the gift shop downstairs sells reacher kit, medical supply store or she can purchase online.   Expected Discharge Plan: Home/Self Care Barriers to Discharge: No Barriers Identified   Patient Goals and CMS Choice Patient states their goals for this hospitalization and ongoing recovery are:: remain independent      Expected Discharge Plan and Services Expected Discharge Plan: Home/Self Care In-house Referral: Clinical Social Work Discharge Planning Services: CM Consult   Living arrangements for the past 2 months: Single Family Home Expected Discharge Date: 05/12/21                                    Prior Living Arrangements/Services Living arrangements for the past 2 months: Single Family Home Lives with:: Adult Children Patient language and need for interpreter reviewed:: Yes Do you feel safe going back to the place where you live?: Yes      Need for Family Participation in Patient Care: No (Comment) Care giver support system in place?: No (comment)   Criminal Activity/Legal Involvement Pertinent to Current Situation/Hospitalization: No - Comment as needed  Activities of Daily Living      Permission Sought/Granted Permission sought to share information with : Case Manager, Family Supports, PCP Permission granted to share information with : Yes, Verbal Permission Granted  Share Information with NAME: Charlotte Stuart     Permission granted to share info w Relationship: daughter  Permission granted to share  info w Contact Information: [phone]  Emotional Assessment       Orientation: : Oriented to Self, Oriented to  Time, Oriented to Place, Oriented to Situation   Psych Involvement: No (comment)  Admission diagnosis:  Respiratory distress [R06.03] Flash pulmonary edema (HCC) [J81.0] Patient Active Problem List   Diagnosis Date Noted   Respiratory failure ([place]) 05/10/2021   (HFpEF) heart failure with preserved ejection fraction ([place]) 05/10/2021   Flash pulmonary edema ([place]) 05/09/2021   Unstable angina ([place]) 05/07/2021   Chest pain 08/05/2020   Colon cancer screening 08/05/2020   Costochondritis 08/05/2020   Flatulence, eructation and gas pain 08/05/2020   Family history of malignant neoplasm of gastrointestinal tract 08/05/2020   Hiatal hernia 08/05/2020   Left lower quadrant pain 08/05/2020   Rectal bleeding 08/05/2020   Acute bilateral low back pain without sciatica 04/23/2020   Daytime somnolence 08/10/2017   Heart palpitations 04/19/2017   Retinal detachment, right 04/09/2017   DOE (dyspnea on exertion) 04/08/2017   Ear pain, bilateral 12/25/2016   Sensorineural hearing loss (SNHL) of right ear with unrestricted hearing of left ear 12/25/2016   Pulsatile tinnitus of right ear 12/22/2016   Heart murmur 10/22/2015   Neck pain 09/22/2015   Hypercalcemia 09/22/2015   Hyperlipidemia 09/22/2015   Insulin long-term use ([place]) 09/22/2015   Vitamin D deficiency 09/22/2015   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 06/26/2015   Allergic  rhinitis 12/27/2013   Constipation 12/27/2013   Edema 12/27/2013   GERD (gastroesophageal reflux disease) 12/27/2013   Hyperlipemia 12/27/2013   Type 2 diabetes mellitus without complication, with long-term current use of insulin ([place]) 12/27/2013   DIABETES MELLITUS, TYPE II 07/14/2010   CORONARY ATHEROSCLEROSIS NATIVE CORONARY ARTERY 05/14/2010   REDNESS OR DISCHARGE OF EYE 08/02/2007   CONTACT DERMATITIS 08/02/2007    OBESITY 06/14/2007   HTN (hypertension) 06/14/2007   GASTROESOPHAGEAL REFLUX DISEASE, HX OF 06/14/2007   PCP:  Loraine Leriche., MD Pharmacy:   [facility] [phone] [address], [place] [address] AT [facility] [address] [place] [address] Phone: [phone] Fax: [phone]  [facility] [address] [place] [address] Phone: [phone] Fax: [phone]     Social Determinants of Health (SDOH) Interventions    Readmission Risk Interventions No flowsheet data found.

## 2021-05-12 NOTE — Progress Notes (Signed)
Discharge instructions provided to patient. Medications changes and follow up appointments reviewed. All questions answered. IV removed. Pts home meds returned from pharmacy. Patient to be escorted home by her daughter.   [place], RN

## 2021-05-12 NOTE — Discharge Summary (Addendum)
Discharge Summary    Patient ID: Charlotte Stuart MRN: [id number]; DOB: 10-Oct-1950  Admit date: 05/08/2021 Discharge date: 05/12/2021  PCP:  Loraine Leriche., MD   [facility] Providers Cardiologist:  Candee Furbish, MD        Discharge Diagnoses    Principal Problem:   Respiratory failure [facility]) Active Problems:   (HFpEF) heart failure with preserved ejection fraction ([place])   HTN (hypertension)   CORONARY ATHEROSCLEROSIS NATIVE CORONARY ARTERY   Type 2 diabetes mellitus without complication, with long-term current use of insulin (HCC)   Unstable angina (HCC)   Flash pulmonary edema ([place])    Diagnostic Studies/Procedures    Cardiac cath 05/07/21 on prior admit   Ost Cx to Prox Cx lesion is 40% stenosed.   Mid LAD lesion is 30% stenosed.   Prox RCA to Mid RCA lesion is 40% stenosed.   1.  Mild to moderate nonobstructive coronary artery disease. 2.  Left ventricular angiography was not performed.  EF was normal by echo. 3.  Moderately elevated left ventricular end-diastolic pressure at 22 mmHg.   Recommendations: Recommend aggressive medical therapy for nonobstructive coronary artery disease. Some of the patient's symptoms are likely related to diastolic heart failure due to uncontrolled hypertension and untreated sleep apnea. I increased the dose of Toprol and lisinopril.  I switched hydrochlorothiazide to small dose furosemide.  Diagnostic Dominance: Right  _____________  ECHO 05/07/21 on prior admit  IMPRESSIONS     1. Left ventricular ejection fraction, by estimation, is 55 to 60%. The  left ventricle has normal function. The left ventricle has no regional  wall motion abnormalities. There is mild concentric left ventricular  hypertrophy. Left ventricular diastolic  parameters are indeterminate.   2. Right ventricular systolic function is normal. The right ventricular  size is normal. There is normal pulmonary artery systolic pressure.   3. The mitral  valve is normal in structure. No evidence of mitral valve  regurgitation. No evidence of mitral stenosis.   4. The aortic valve is tricuspid. Aortic valve regurgitation is not  visualized. No aortic stenosis is present.   5. The inferior vena cava is normal in size with greater than 50%  respiratory variability, suggesting right atrial pressure of 3 mmHg.   FINDINGS   Left Ventricle: Left ventricular ejection fraction, by estimation, is 55  to 60%. The left ventricle has normal function. The left ventricle has no  regional wall motion abnormalities. The left ventricular internal cavity  size was normal in size. There is   mild concentric left ventricular hypertrophy. Left ventricular diastolic  parameters are indeterminate.   Right Ventricle: The right ventricular size is normal. No increase in  right ventricular wall thickness. Right ventricular systolic function is  normal. There is normal pulmonary artery systolic pressure. The tricuspid  regurgitant velocity is 1.64 m/s, and   with an assumed right atrial pressure of 3 mmHg, the estimated right  ventricular systolic pressure is 44.0 mmHg.   Left Atrium: Left atrial size was normal in size.   Right Atrium: Right atrial size was normal in size.   Pericardium: There is no evidence of pericardial effusion.   Mitral Valve: The mitral valve is normal in structure. There is mild  thickening of the mitral valve leaflet(s). No evidence of mitral valve  regurgitation. No evidence of mitral valve stenosis.   Tricuspid Valve: The tricuspid valve is normal in structure. Tricuspid  valve regurgitation is trivial. No evidence of tricuspid stenosis.   Aortic  Valve: The aortic valve is tricuspid. Aortic valve regurgitation is  not visualized. No aortic stenosis is present. Aortic valve mean gradient  measures 6.0 mmHg. Aortic valve peak gradient measures 8.4 mmHg. Aortic  valve area, by VTI measures 2.41  cm.   Pulmonic Valve: The  pulmonic valve was normal in structure. Pulmonic valve  regurgitation is trivial. No evidence of pulmonic stenosis.   Aorta: The aortic root is normal in size and structure.   Venous: The inferior vena cava is normal in size with greater than 50%  respiratory variability, suggesting right atrial pressure of 3 mmHg.   IAS/Shunts: No atrial level shunt detected by color flow Doppler.  History of Present Illness     Charlotte Stuart is a 70 y.o. female with diabetes, hiatal hernia, HTN, HLD admitted 05/06/21 and discharged 05/08/21 with unstable angina and SOB, hx on non obstructive CAD with an MI 25 years ago.  She developed chest pain at rest. She underwent cardiac cath with non obstructive disease.  On discharge she was stable and continued on her insulin and recommendations for follow up with PCP and endocrinology.     Later on 05/08/21 she returned to ER by EMS for SOB and chest pain.  On arrival home she had a sandwich, then upon ging to restroom felt very SOB.  She panicked was anxious and called 911.  She was in Respiratory failure with sp02 at 86% on RA, she was given ASA and 2 SL NTG by EMS with some help.   BP was 220/110.  She was given 02 at 15 L then weaned to 5 L when seen by cardiology.  She was given IV lasix. She was admitted for acute flash pulmonary edema and BP management.     Hospital Course     Consultants: none   After admit she had a few episodes of acute SOB. Treated with IV lasix.  She was diuresing.  + cough on the 17th.  With diuresis she was hypokalemic and aldactone added to meds. With cough CTA of chest was done with neg for PE, small R.L pl effusions + pulmonary edema.    Yesterday she was ambulated without issues.   Today she has been seen and evaluated by Dr. Audie Box and found stable for discharge.    She is neg 8,100 ml since admit and wt down from pk 117. 9 kg to 112.5 Kg (259.38 lbs to 247.5 lbs, loss of 11.88 lbs)    Diet of low salt has been explained to  her.  She is on coreg, ASA, aldactone and now torsemide 20 mg daily.  Also on lisinopril.  Her glucose has been elevated to 383 at times.  Will send home on prior diabetes management. She will follow up with PCP in near future.  K+ at 3.6 , Cr up slightly at 1.22  will need BMP as outpt.    Did the patient have an acute coronary syndrome (MI, NSTEMI, STEMI, etc) this admission?:  No                               Did the patient have a percutaneous coronary intervention (stent / angioplasty)?:  No.       _____________  Discharge Vitals Blood pressure 136/61, pulse 64, temperature 97.9 F (36.6 C), temperature source Oral, resp. rate 18, weight 112.5 kg, SpO2 100 %.  Filed Weights   05/10/21 0318 05/11/21 0630 05/12/21  0445  Weight: 117.9 kg (P) 111.7 kg 112.5 kg   Per Dr. Audie Box.  General:Pleasant affect, NAD Skin:Warm and dry, brisk capillary refill HEENT:normocephalic, sclera clear, mucus membranes moist Neck:supple, no JVD, no bruits  Heart:S1S2 RRR without murmur, gallup, rub or click Lungs:clear without rales, rhonchi, or wheezes OQH:UTML, non tender, + BS, do not palpate liver spleen or masses Ext:no lower ext edema, 2+ pedal pulses, 2+ radial pulses Neuro:alert and oriented, MAE, follows commands, + facial symmetry   Labs & Radiologic Studies    CBC No results for input(s): WBC, NEUTROABS, HGB, HCT, MCV, PLT in the last 72 hours. Basic Metabolic Panel Recent Labs    05/11/21 0117 05/12/21 0146  NA 140 138  K 3.8 3.6  CL 97* 97*  CO2 31 29  GLUCOSE 225* 117*  BUN 20 34*  CREATININE 1.04* 1.22*  CALCIUM 9.7 9.7   Liver Function Tests No results for input(s): AST, ALT, ALKPHOS, BILITOT, PROT, ALBUMIN in the last 72 hours. No results for input(s): LIPASE, AMYLASE in the last 72 hours. High Sensitivity Troponin:   Recent Labs  Lab 05/06/21 1934 05/06/21 2324  TROPONINIHS 8 8    BNP Invalid input(s): POCBNP D-Dimer No results for input(s): DDIMER in the  last 72 hours. Hemoglobin A1C No results for input(s): HGBA1C in the last 72 hours. Fasting Lipid Panel No results for input(s): CHOL, HDL, LDLCALC, TRIG, CHOLHDL, LDLDIRECT in the last 72 hours. Thyroid Function Tests No results for input(s): TSH, T4TOTAL, T3FREE, THYROIDAB in the last 72 hours.  Invalid input(s): FREET3 _____________  DG Chest 1 View  Result Date: 05/06/2021 CLINICAL DATA:  Chest pain and shortness of breath for 2 hours. EXAM: CHEST  1 VIEW COMPARISON:  Chest x-ray 03/14/2020, CT chest 04/08/2017 FINDINGS: The heart and mediastinal contours are unchanged. No focal consolidation. No pulmonary edema. No pleural effusion. No pneumothorax. No acute osseous abnormality. Bilateral shoulder degenerative changes. IMPRESSION: No active disease. Electronically Signed   By: Iven Finn M.D.   On: 05/06/2021 20:04   CT Angio Chest Pulmonary Embolism (PE) W or WO Contrast  Result Date: 05/11/2021 CLINICAL DATA:  Chest pain or shortness of breath, pleurisy or effusion suspected EXAM: CT ANGIOGRAPHY CHEST WITH CONTRAST TECHNIQUE: Multidetector CT imaging of the chest was performed using the standard protocol during bolus administration of intravenous contrast. Multiplanar CT image reconstructions and MIPs were obtained to evaluate the vascular anatomy. CONTRAST:  17m OMNIPAQUE IOHEXOL 350 MG/ML SOLN COMPARISON:  04/08/2017 CT chest, correlation is also made with 05/08/2021 chest radiograph. FINDINGS: Cardiovascular: Satisfactory opacification of the pulmonary arteries to the segmental level. No evidence of pulmonary embolism. Normal heart size. No pericardial effusion. Mild aortic atherosclerotic calcifications. Coronary artery calcifications. Mediastinum/Nodes: No enlarged mediastinal, hilar, or axillary lymph nodes. Thyroid gland, trachea, and esophagus demonstrate no significant findings. Lungs/Pleura: Small right-greater-than-left pleural effusions with associated atelectasis.  Interlobular septal thickening, likely pulmonary edema. Upper Abdomen: No acute abnormality. Musculoskeletal: No acute osseous abnormality. Review of the MIP images confirms the above findings. IMPRESSION: 1. Small, right-greater-than-left pleural effusions, with associated atelectasis, and interlobular septal thickening, likely pulmonary edema. Correlate with BNP and symptoms of heart failure. 2. Negative for pulmonary embolism to the level of the segmental arteries. Aortic Atherosclerosis (ICD10-I70.0). Electronically Signed   By: AMerilyn BabaM.D.   On: 05/11/2021 00:33   CARDIAC CATHETERIZATION  Result Date: 05/07/2021   Ost Cx to Prox Cx lesion is 40% stenosed.   Mid LAD lesion is 30% stenosed.  Prox RCA to Mid RCA lesion is 40% stenosed. 1.  Mild to moderate nonobstructive coronary artery disease. 2.  Left ventricular angiography was not performed.  EF was normal by echo. 3.  Moderately elevated left ventricular end-diastolic pressure at 22 mmHg. Recommendations: Recommend aggressive medical therapy for nonobstructive coronary artery disease. Some of the patient's symptoms are likely related to diastolic heart failure due to uncontrolled hypertension and untreated sleep apnea. I increased the dose of Toprol and lisinopril.  I switched hydrochlorothiazide to small dose furosemide.   DG Chest Port 1 View  Result Date: 05/08/2021 CLINICAL DATA:  Shortness of breath EXAM: PORTABLE CHEST 1 VIEW COMPARISON:  05/06/2021 FINDINGS: Cardiomegaly with interim vascular congestion and development of perihilar and basilar hazy opacity likely edema. There are probable small effusions. No pneumothorax. IMPRESSION: Cardiomegaly with vascular congestion, probable hazy perihilar and lung base edema, and suspected small effusions Electronically Signed   By: Donavan Foil M.D.   On: 05/08/2021 22:10   ECHOCARDIOGRAM COMPLETE  Result Date: 05/07/2021    ECHOCARDIOGRAM REPORT   Patient Name:   Charlotte Stuart Date of  Exam: 05/07/2021 Medical Rec #:  [id number]         Height:       61.0 in Accession #:    [id number]        Weight:       240.0 lb Date of Birth:  [birth year]         BSA:          2.041 m Patient Age:    8 years          BP:           146/57 mmHg Patient Gender: F                 HR:           81 bpm. Exam Location:  Inpatient Procedure: 2D Echo, Cardiac Doppler and Color Doppler Indications:    R07.9* Chest pain, unspecified  History:        Patient has no prior history of Echocardiogram examinations.                 Signs/Symptoms:Chest Pain.  Sonographer:    [place] Referring Phys: [id number] [place]  1. Left ventricular ejection fraction, by estimation, is 55 to 60%. The left ventricle has normal function. The left ventricle has no regional wall motion abnormalities. There is mild concentric left ventricular hypertrophy. Left ventricular diastolic parameters are indeterminate.  2. Right ventricular systolic function is normal. The right ventricular size is normal. There is normal pulmonary artery systolic pressure.  3. The mitral valve is normal in structure. No evidence of mitral valve regurgitation. No evidence of mitral stenosis.  4. The aortic valve is tricuspid. Aortic valve regurgitation is not visualized. No aortic stenosis is present.  5. The inferior vena cava is normal in size with greater than 50% respiratory variability, suggesting right atrial pressure of 3 mmHg. FINDINGS  Left Ventricle: Left ventricular ejection fraction, by estimation, is 55 to 60%. The left ventricle has normal function. The left ventricle has no regional wall motion abnormalities. The left ventricular internal cavity size was normal in size. There is  mild concentric left ventricular hypertrophy. Left ventricular diastolic parameters are indeterminate. Right Ventricle: The right ventricular size is normal. No increase in right ventricular wall thickness. Right ventricular systolic function is normal. There is normal  pulmonary artery systolic pressure. The tricuspid regurgitant velocity  is 1.64 m/s, and  with an assumed right atrial pressure of 3 mmHg, the estimated right ventricular systolic pressure is 65.4 mmHg. Left Atrium: Left atrial size was normal in size. Right Atrium: Right atrial size was normal in size. Pericardium: There is no evidence of pericardial effusion. Mitral Valve: The mitral valve is normal in structure. There is mild thickening of the mitral valve leaflet(s). No evidence of mitral valve regurgitation. No evidence of mitral valve stenosis. Tricuspid Valve: The tricuspid valve is normal in structure. Tricuspid valve regurgitation is trivial. No evidence of tricuspid stenosis. Aortic Valve: The aortic valve is tricuspid. Aortic valve regurgitation is not visualized. No aortic stenosis is present. Aortic valve mean gradient measures 6.0 mmHg. Aortic valve peak gradient measures 8.4 mmHg. Aortic valve area, by VTI measures 2.41 cm. Pulmonic Valve: The pulmonic valve was normal in structure. Pulmonic valve regurgitation is trivial. No evidence of pulmonic stenosis. Aorta: The aortic root is normal in size and structure. Venous: The inferior vena cava is normal in size with greater than 50% respiratory variability, suggesting right atrial pressure of 3 mmHg. IAS/Shunts: No atrial level shunt detected by color flow Doppler.  LEFT VENTRICLE PLAX 2D LVIDd:         4.00 cm     Diastology LVIDs:         2.80 cm     LV e' medial:    9.57 cm/s LV PW:         1.22 cm     LV E/e' medial:  10.2 LV IVS:        1.10 cm     LV e' lateral:   7.72 cm/s LVOT diam:     1.80 cm     LV E/e' lateral: 12.6 LV SV:         90 LV SV Index:   44 LVOT Area:     2.54 cm  LV Volumes (MOD) LV vol d, MOD A4C: 35.2 ml LV vol s, MOD A4C: 13.6 ml LV SV MOD A4C:     35.2 ml RIGHT VENTRICLE RV S prime:     11.30 cm/s TAPSE (M-mode): 3.0 cm LEFT ATRIUM             Index       RIGHT ATRIUM           Index LA diam:        2.50 cm 1.22 cm/m  RA  Area:     14.40 cm LA Vol (A2C):   21.2 ml 10.39 ml/m RA Volume:   33.50 ml  16.41 ml/m LA Vol (A4C):   22.8 ml 11.17 ml/m LA Biplane Vol: 23.0 ml 11.27 ml/m  AORTIC VALVE                    PULMONIC VALVE AV Area (Vmax):    2.25 cm     PV Vmax:       0.52 m/s AV Area (Vmean):   2.08 cm     PV Peak grad:  1.1 mmHg AV Area (VTI):     2.41 cm AV Vmax:           145.00 cm/s AV Vmean:          114.000 cm/s AV VTI:            0.372 m AV Peak Grad:      8.4 mmHg AV Mean Grad:      6.0 mmHg LVOT Vmax:  128.00 cm/s LVOT Vmean:        93.100 cm/s LVOT VTI:          0.352 m LVOT/AV VTI ratio: 0.95  AORTA Ao Root diam: 3.00 cm Ao Asc diam:  3.00 cm MITRAL VALVE               TRICUSPID VALVE MV Area (PHT): 4.58 cm    TR Peak grad:   10.8 mmHg MV E velocity: 97.50 cm/s  TR Vmax:        164.00 cm/s MV A velocity: 86.30 cm/s MV E/A ratio:  1.13        SHUNTS                            Systemic VTI:  0.35 m                            Systemic Diam: 1.80 cm Skeet Latch MD Electronically signed by Skeet Latch MD Signature Date/Time: 05/07/2021/2:28:13 PM    Final    Disposition   Pt is being discharged home today in good condition.  Follow-up Plans & Appointments   Heart Healthy low salt Diabetic diet.   If you have scales, weigh daily and keep a log to take to follow up visit. Weigh each morning after going to bathroom.  The first appt is 05/20/21 and it is in heart failure clinic for one time, you will park at [facility]  and parking code is [id number],   time at 10 AM come 15 min early to check in.    We adjusted your medications please follow discharge list. Torsemide is taking the place of Lasix or furosemide.    Follow-up Information     Jerline Pain, MD Follow up on 05/29/2021.   Specialty: Cardiology Why: at 2:45 with his PA Vin. Contact information: [address] [address] [phone]         [facility] Follow  up on 05/20/2021.   Specialty: Cardiology Why: at 10:00 AM Contact information: [address] [phone] [place]        Loraine Leriche., MD Follow up.   Specialty: Internal Medicine Why: call and make appt for diabetes management. or the physician who follows your diabetes. Contact information: [place] [address] [phone]         Jerline Pain, MD .   Specialty: Cardiology Contact information: [phone] [place] [address] [phone]                  Discharge Medications   Allergies as of 05/12/2021       Reactions   Iodinated Diagnostic Agents Itching, Swelling   Burning, swelling burns        Medication List     STOP taking these medications    clotrimazole-betamethasone cream Commonly known as: Lotrisone   furosemide 20 MG tablet Commonly known as: LASIX   metoprolol succinate 25 MG 24 hr tablet Commonly known as: TOPROL-XL   metoprolol succinate 50 MG 24 hr tablet Commonly known as: TOPROL-XL   NONFORMULARY OR COMPOUNDED ITEM   NONFORMULARY OR COMPOUNDED ITEM       TAKE these medications    acetaminophen 500 MG tablet Commonly known as: TYLENOL Take 1,000 mg by mouth  every 6 (six) hours as needed for moderate pain.   albuterol 108 (90 Base) MCG/ACT inhaler Commonly known as: VENTOLIN HFA Inhale 2 puffs into the lungs every 6 (six) hours as needed for wheezing.   ARTIFICIAL TEARS OP Place 1 drop into both eyes in the morning, at noon, in the evening, and at bedtime.   aspirin EC 81 MG tablet Take 81 mg by mouth daily.   BD Pen Needle Nano U/F 32G X 4 MM Misc Generic drug: Insulin Pen Needle U UTD WITH VICTOZA   carvedilol 12.5 MG tablet Commonly known as: COREG Take 1 tablet (12.5 mg total) by mouth 2 (two) times daily with a meal.   empagliflozin 10 MG Tabs tablet Commonly known as: JARDIANCE Take 1 tablet (10 mg  total) by mouth daily.   FreeStyle [employer] Scan as needed up to 3 times per day as directed. Dx E11.65   FreeStyle Libre 14 Day Sensor Misc FreeStyle [place] 14 Day Sensor kit   [employer] Misc Inject into the skin.   glucose blood test strip Accu-Chek SmartView Test Strips  TEST THREE TIMES DAILY AS DIRECTED.   HumuLIN R U-500 KwikPen 500 UNIT/ML KwikPen Generic drug: insulin regular human CONCENTRATED Inject 50-100 Units into the skin 2 (two) times daily with a meal. 100 units in AM and 50 units in PM   INSULIN SYRINGE 1CC/31GX5/16" 31G X 5/16" 1 ML Misc   INSULIN SYRINGE .5CC/31GX5/16" 31G X 5/16" 0.5 ML Misc   linaclotide 145 MCG Caps capsule Commonly known as: LINZESS Take 145 mcg by mouth daily.   liraglutide 18 MG/3ML Sopn Commonly known as: VICTOZA Inject 1.8 mg into the skin daily.   lisinopril 40 MG tablet Commonly known as: ZESTRIL Take 1 tablet (40 mg total) by mouth daily. What changed:  medication strength how much to take   omeprazole 40 MG capsule Commonly known as: PRILOSEC Take 40 mg by mouth daily.   rosuvastatin 20 MG tablet Commonly known as: CRESTOR Take 1 tablet (20 mg total) by mouth daily.   spironolactone 25 MG tablet Commonly known as: ALDACTONE Take 1 tablet (25 mg total) by mouth daily.   torsemide 20 MG tablet Commonly known as: DEMADEX Take 1 tablet (20 mg total) by mouth daily.   Vitamin D (Ergocalciferol) 1.25 MG (50000 UNIT) Caps capsule Commonly known as: DRISDOL Take 50,000 Units by mouth every Wednesday.           Outstanding Labs/Studies   BMP on 05/20/21  Duration of Discharge Encounter   Greater than 30 minutes including physician time.  Signed, Cecilie Kicks, NP 05/12/2021, 9:16 AM

## 2021-05-16 ENCOUNTER — Ambulatory Visit (INDEPENDENT_AMBULATORY_CARE_PROVIDER_SITE_OTHER): Payer: Medicare HMO | Admitting: *Deleted

## 2021-05-16 ENCOUNTER — Other Ambulatory Visit: Payer: Self-pay

## 2021-05-16 DIAGNOSIS — E1142 Type 2 diabetes mellitus with diabetic polyneuropathy: Secondary | ICD-10-CM

## 2021-05-16 NOTE — Progress Notes (Signed)
Patient presents to the office today for diabetic shoe and insole measuring.  Patient was measured with brannock device to determine size and width for 1 pair of extra depth shoes and foam casted for 3 pair of insoles.   Documentation of medical necessity will be sent to patient's treating diabetic doctor to verify and sign.   Patient's diabetic provider: Dr. Idolina Primer  Shoes and insoles will be ordered at that time and patient will be notified for an appointment for fitting when they arrive.   Shoe size (per patient): 9   Brannock measurement: RIGHT/LEFT - 8.5 D  Patient shoe selection-   1st choice:   Apex A700 (order x-wide)  2nd choice:  Orthofeet 835 (order wide)  Shoe size ordered: Women's 8.5 Wide or X-Wide (given style of shoe available)

## 2021-05-19 NOTE — Progress Notes (Signed)
[facility]  PCP: Loraine Leriche., MD  Primary Cardiologist: Candee Furbish, MD   HPI:  Charlotte Stuart is a 70 y.o.  female  with a PMH significant for CAD, T2DM, HTN, HLD,    No real cardiac hx prior to 05/06/2021 when she presented to [facility] with chest pain.  Underwent LHC with results below:   CARDIAC CATH: 05/07/2021   Ost Cx to Prox Cx lesion is 40% stenosed.   Mid LAD lesion is 30% stenosed.   Prox RCA to Mid RCA lesion is 40% stenosed.   1.  Mild to moderate nonobstructive coronary artery disease. 2.  Left ventricular angiography was not performed.  EF was normal by echo. 3.  Moderately elevated left ventricular end-diastolic pressure at 22 mmHg.   ECHO 05/07/21 EF 55-60%.  GDMT adjusted switched to low dose lasix from HCTZ, lisinopril and bb increased.  Returned 05/08/21 with acute respiratory failure, severe HTN 220/110.  Chest x-ray with pulm edema.  She was started on IV diuretics, GDMT titrated.  wt 572>620 during admission. Lasix 100m switched to torsemide 225mon d/c and spironolactone/jardiance added to regimen.    Feeling better since discharge, still having some dyspnea on exertion but has improved about 90% compared to before.  Has to stop with about 20 yards of walking to rest and because of bilateral leg pain.  Denies any chest pain.  BP at home has been 107/47, 109/68, averages about 100/50-60.  BP high here today because she hasn't taken her medicine yet today.  Weight by home scale at d/c 241 now 238 this am.  Has been on a low salt diet, more vegetables.  Does have some dizziness and light headedness since discharge this is orthostatic.     ROS: All systems negative except as listed in HPI, PMH and Problem List.  SH:  Social History   Socioeconomic History   Marital status: Single    Spouse name: Not on file   Number of children: 2   Years of education: Not on file   Highest education level: Associate degree:  occupational, teHotel manageror vocational program  Occupational History   Occupation: disability  Tobacco Use   Smoking status: Never   Smokeless tobacco: Former    Types: Snuff    Quit date: 12/2020   Tobacco comments:    Snuff x 30 years  Vaping Use   Vaping Use: Never used  Substance and Sexual Activity   Alcohol use: Not Currently   Drug use: Never   Sexual activity: Not on file  Other Topics Concern   Not on file  Social History Narrative   Not on file   Social Determinants of Health   Financial Resource Strain: Low Risk    Difficulty of Paying Living Expenses: Not hard at all  Food Insecurity: No Food Insecurity   Worried About RuCharity fundraisern the Last Year: Never true   [place] the Last Year: Never true  Transportation Needs: No Transportation Needs   Lack of Transportation (Medical): No   Lack of Transportation (Non-Medical): No  Physical Activity: Not on file  Stress: Not on file  Social Connections: Not on file  Intimate Partner Violence: Not on file    FH: No family history on file.  Past Medical History:  Diagnosis Date   CAD (coronary artery disease)    Diabetes mellitus without complication ([place]   GERD (gastroesophageal reflux disease)  Heart murmur    Hypercalcemia    Hyperlipidemia    Hypertension    Retinal detachment    right   Sensorineural hearing loss (SNHL) of right ear with unrestricted hearing of left ear    Vitamin D deficiency     Current Outpatient Medications  Medication Sig Dispense Refill   acetaminophen (TYLENOL) 500 MG tablet Take 1,000 mg by mouth every 6 (six) hours as needed for moderate pain.     aspirin EC 81 MG tablet Take 81 mg by mouth daily.     BD PEN NEEDLE NANO U/F 32G X 4 MM MISC U UTD WITH VICTOZA  5   Carboxymethylcellulose Sodium (ARTIFICIAL TEARS OP) Place 1 drop into both eyes in the morning, at noon, in the evening, and at bedtime.     carvedilol (COREG) 12.5 MG tablet Take 1 tablet (12.5  mg total) by mouth 2 (two) times daily with a meal. 60 tablet 6   Continuous Blood Gluc Receiver (FREESTYLE LIBRE READER) DEVI Scan as needed up to 3 times per day as directed. Dx E11.65     Continuous Blood Gluc Sensor (FREESTYLE LIBRE 14 DAY SENSOR) MISC FreeStyle [place] 14 Day Sensor kit     Continuous Blood Gluc Sensor ([place]) MISC Inject into the skin.     empagliflozin (JARDIANCE) 10 MG TABS tablet Take 1 tablet (10 mg total) by mouth daily. 30 tablet 6   glucose blood test strip Accu-Chek SmartView Test Strips  TEST THREE TIMES DAILY AS DIRECTED.     insulin regular human CONCENTRATED (HUMULIN R U-500 KWIKPEN) 500 UNIT/ML KwikPen Inject 50-100 Units into the skin 2 (two) times daily with a meal. 100 units in AM and 50 units in PM     Insulin Syringe-Needle U-100 (INSULIN SYRINGE .5CC/31GX5/16") 31G X 5/16" 0.5 ML MISC      Insulin Syringe-Needle U-100 (INSULIN SYRINGE 1CC/31GX5/16") 31G X 5/16" 1 ML MISC      linaclotide (LINZESS) 145 MCG CAPS capsule Take 145 mcg by mouth daily.     liraglutide (VICTOZA) 18 MG/3ML SOPN Inject 1.8 mg into the skin daily.     lisinopril (ZESTRIL) 40 MG tablet Take 1 tablet (40 mg total) by mouth daily. 30 tablet 6   omeprazole (PRILOSEC) 40 MG capsule Take 40 mg by mouth daily.     rosuvastatin (CRESTOR) 20 MG tablet Take 1 tablet (20 mg total) by mouth daily. 30 tablet 6   spironolactone (ALDACTONE) 25 MG tablet Take 1 tablet (25 mg total) by mouth daily. 30 tablet 6   torsemide (DEMADEX) 20 MG tablet Take 1 tablet (20 mg total) by mouth daily. 30 tablet 6   Vitamin D, Ergocalciferol, (DRISDOL) 1.25 MG (50000 UNIT) CAPS capsule Take 50,000 Units by mouth every Wednesday.     No current facility-administered medications for this encounter.    Vitals:   05/20/21 1027  BP: (!) 160/70  Pulse: 87  SpO2: 97%  Weight: 107.5 kg (237 lb)    PHYSICAL EXAM: Cardiac: JVD flat, normal rate and rhythm, clear s1 and s2, no murmurs, rubs  or gallops, no LE edema Pulmonary: CTAB, not in distress Abdominal: non distended abdomen, soft and nontender Psych: Alert, conversant, in good spirits   ASSESSMENT & PLAN: Chronic HFpEF -ECHO 05/07/21 EF 55-60%. -LHC 05/07/21 non obstructive 3 vessel CAD  -NYHA Class  II-III symptoms, euvolemic to slightly dry -I discussed I feel like she may be getting a bit too dry with the recent  medication changes given her labs, examination and orthostatic symptoms.  She is very afraid of decreasing her torsemide.  We discussed even starting with switching to every other day.  Ultimately she did agree to discuss this further if her bmp today was again suggestive of some signs of prerenal increase of her cr.   -Continue carvedilol 12.5 BID, lisinopril 40 daily, spiro 25 daily, Jardiance 44m, torsemide 20 daily  CAD -LHC 05/07/21 non obstructive 3 vessel CAD -continue asa, statin, GDMT above   T2DM -Continue jardiance   [facility]

## 2021-05-20 ENCOUNTER — Other Ambulatory Visit: Payer: Self-pay

## 2021-05-20 ENCOUNTER — Ambulatory Visit (HOSPITAL_COMMUNITY)
Admit: 2021-05-20 | Discharge: 2021-05-20 | Disposition: A | Discharge status: Home / Self Care | Payer: Medicare HMO | Attending: Internal Medicine | Admitting: Internal Medicine

## 2021-05-20 ENCOUNTER — Telehealth (HOSPITAL_COMMUNITY): Payer: Self-pay

## 2021-05-20 ENCOUNTER — Encounter (HOSPITAL_COMMUNITY): Payer: Self-pay

## 2021-05-20 VITALS — BP 160/70 | HR 87 | Wt 237.0 lb

## 2021-05-20 DIAGNOSIS — Z7982 Long term (current) use of aspirin: Secondary | ICD-10-CM | POA: Diagnosis not present

## 2021-05-20 DIAGNOSIS — I110 Hypertensive heart disease with heart failure: Secondary | ICD-10-CM | POA: Insufficient documentation

## 2021-05-20 DIAGNOSIS — M79604 Pain in right leg: Secondary | ICD-10-CM | POA: Diagnosis not present

## 2021-05-20 DIAGNOSIS — Z7984 Long term (current) use of oral hypoglycemic drugs: Secondary | ICD-10-CM | POA: Insufficient documentation

## 2021-05-20 DIAGNOSIS — I5032 Chronic diastolic (congestive) heart failure: Secondary | ICD-10-CM | POA: Diagnosis not present

## 2021-05-20 DIAGNOSIS — M79605 Pain in left leg: Secondary | ICD-10-CM | POA: Insufficient documentation

## 2021-05-20 DIAGNOSIS — E785 Hyperlipidemia, unspecified: Secondary | ICD-10-CM | POA: Insufficient documentation

## 2021-05-20 DIAGNOSIS — E119 Type 2 diabetes mellitus without complications: Secondary | ICD-10-CM | POA: Insufficient documentation

## 2021-05-20 DIAGNOSIS — E1169 Type 2 diabetes mellitus with other specified complication: Secondary | ICD-10-CM | POA: Diagnosis not present

## 2021-05-20 DIAGNOSIS — I2510 Atherosclerotic heart disease of native coronary artery without angina pectoris: Secondary | ICD-10-CM | POA: Diagnosis not present

## 2021-05-20 DIAGNOSIS — Z79899 Other long term (current) drug therapy: Secondary | ICD-10-CM | POA: Insufficient documentation

## 2021-05-20 DIAGNOSIS — Z794 Long term (current) use of insulin: Secondary | ICD-10-CM | POA: Diagnosis not present

## 2021-05-20 DIAGNOSIS — I5030 Unspecified diastolic (congestive) heart failure: Secondary | ICD-10-CM | POA: Diagnosis not present

## 2021-05-20 LAB — BASIC METABOLIC PANEL
Anion gap: 10 (ref 5–15)
BUN: 44 mg/dL — ABNORMAL HIGH (ref 8–23)
CO2: 26 mmol/L (ref 22–32)
Calcium: 9.7 mg/dL (ref 8.9–10.3)
Chloride: 97 mmol/L — ABNORMAL LOW (ref 98–111)
Creatinine, Ser: 1.82 mg/dL — ABNORMAL HIGH (ref 0.44–1.00)
GFR, Estimated: 30 mL/min — ABNORMAL LOW (ref >60)
Glucose, Bld: 89 mg/dL (ref 70–99)
Potassium: 4.6 mmol/L (ref 3.5–5.1)
Sodium: 133 mmol/L — ABNORMAL LOW (ref 135–145)

## 2021-05-20 NOTE — Telephone Encounter (Signed)
Call attempted to confirm HV TOC appt today at 10AM. HIPPA appropriate VM left with callback number.   Ozella Rocks, MSN, RN Heart Failure Nurse Navigator [phone]

## 2021-05-20 NOTE — Patient Instructions (Signed)
Great to see you today! Lab work today-- we will call you only with abnormal values. Follow-up with Cardiologist as scheduled.

## 2021-05-21 ENCOUNTER — Telehealth (HOSPITAL_COMMUNITY): Payer: Self-pay | Admitting: Internal Medicine

## 2021-05-21 NOTE — Telephone Encounter (Addendum)
Called patient left voicemail to discuss lab results with worsening renal fx.  Asked her to not take medications yet today and call us back for medication changes.    Addendum: Patient did return my call around 11:00 am. Spoke with pt and daughter. She has not taken meds yet today.  We discussed lab results and plan going forward.  She will hold renally active meds x 2 days, liberalize fluid intake today and switch torsemide to PRN.  We discussed her dry weight likely closer to 242lbs, she remains at 238 today.  She will change torsemide to PRN and discussed in detail when and how to use it in the future.  When she restarts lisinopril and spironolactone she will do so at 20mg  and 12.5mg  respectively (half current dosages).  I expect her renal function will be improved by FU appointment with Dr. .

## 2021-05-27 NOTE — Progress Notes (Signed)
Patient referred by Loraine Leriche.,* for HFpEF  Subjective:   Charlotte Stuart, female    DOB: 1951-02-10, 70 y.o.   MRN: [id number]   Chief Complaint  Patient presents with   Chest Pain   Congestive Heart Failure   Hospitalization Follow-up     HPI  70 year old African-American female with hypertension, type 2 diabetes mellitus, CKD stage IIIb, HFpEF, recent recurrent hospital admissions with flash pulmonary edema.   Patient is here today to establish care with me. She was recently hospitalized with dyspnea, chest pai, fatigue. Cath showed nonobstructive CAD. Echocardiogram showed normal EF, did not show any major diastolic dysfunction.  However, BNP was elevated.  She was treated for HFpEF.   Patient is here with her daughter today.  She still uses half a tablespoon added salt on her plate, in addition to salt used in cooking.  She does report exertional dyspnea and occasional chest pain.  In addition, she reports generalized fatigue and claudication.  She sees a podiatrist regularly.  She does not see a nephrologist.  I personally reviewed her coronary angiogram and echocardiogram performed during recent hospitalization.  Past Medical History:  Diagnosis Date   CAD (coronary artery disease)    Diabetes mellitus without complication (HCC)    GERD (gastroesophageal reflux disease)    Heart murmur    Hypercalcemia    Hyperlipidemia    Hypertension    Retinal detachment    right   Sensorineural hearing loss (SNHL) of right ear with unrestricted hearing of left ear    Vitamin D deficiency      Past Surgical History:  Procedure Laterality Date   BREAST BIOPSY     LEFT HEART CATH AND CORONARY ANGIOGRAPHY N/A 05/07/2021   Procedure: LEFT HEART CATH AND CORONARY ANGIOGRAPHY;  Surgeon: Wellington Hampshire, MD;  Location: [place] [facility];  Service: Cardiovascular;  Laterality: N/A;     Social History   Tobacco Use  Smoking Status Never  Smokeless Tobacco  Former   Types: Snuff   Quit date: 12/2020  Tobacco Comments   Snuff x 30 years    Social History   Substance and Sexual Activity  Alcohol Use Not Currently     Family History  Problem Relation Age of Onset   Heart attack Father    Hypertension Sister    Diabetes Sister    Hypertension Brother    Diabetes Brother    Hypertension Brother    Diabetes Brother    Hypertension Brother    Diabetes Brother      Current Outpatient Medications on File Prior to Visit  Medication Sig Dispense Refill   acetaminophen (TYLENOL) 500 MG tablet Take 1,000 mg by mouth every 6 (six) hours as needed for moderate pain.     aspirin EC 81 MG tablet Take 81 mg by mouth daily.     BD PEN NEEDLE NANO U/F 32G X 4 MM MISC U UTD WITH VICTOZA  5   Carboxymethylcellulose Sodium (ARTIFICIAL TEARS OP) Place 1 drop into both eyes in the morning, at noon, in the evening, and at bedtime.     carvedilol (COREG) 12.5 MG tablet Take 1 tablet (12.5 mg total) by mouth 2 (two) times daily with a meal. 60 tablet 6   Continuous Blood Gluc Receiver (FREESTYLE LIBRE READER) DEVI Scan as needed up to 3 times per day as directed. Dx E11.65     Continuous Blood Gluc Sensor (FREESTYLE LIBRE 14 DAY SENSOR) MISC FreeStyle [place]  14 Day Sensor kit     Continuous Blood Gluc Sensor ([place]) MISC Inject into the skin.     empagliflozin (JARDIANCE) 10 MG TABS tablet Take 1 tablet (10 mg total) by mouth daily. 30 tablet 6   glucose blood test strip Accu-Chek SmartView Test Strips  TEST THREE TIMES DAILY AS DIRECTED.     insulin regular human CONCENTRATED (HUMULIN R U-500 KWIKPEN) 500 UNIT/ML KwikPen Inject 50-100 Units into the skin 2 (two) times daily with a meal. 100 units in AM and 50 units in PM     Insulin Syringe-Needle U-100 (INSULIN SYRINGE .5CC/31GX5/16") 31G X 5/16" 0.5 ML MISC      Insulin Syringe-Needle U-100 (INSULIN SYRINGE 1CC/31GX5/16") 31G X 5/16" 1 ML MISC      linaclotide (LINZESS) 145 MCG  CAPS capsule Take 145 mcg by mouth daily.     liraglutide (VICTOZA) 18 MG/3ML SOPN Inject 1.8 mg into the skin daily.     lisinopril (ZESTRIL) 40 MG tablet Take 1 tablet (40 mg total) by mouth daily. 30 tablet 6   omeprazole (PRILOSEC) 40 MG capsule Take 40 mg by mouth daily.     rosuvastatin (CRESTOR) 20 MG tablet Take 1 tablet (20 mg total) by mouth daily. 30 tablet 6   spironolactone (ALDACTONE) 25 MG tablet Take 1 tablet (25 mg total) by mouth daily. (Patient taking differently: Take 12.5 mg by mouth daily.) 30 tablet 6   torsemide (DEMADEX) 20 MG tablet Take 1 tablet (20 mg total) by mouth daily. 30 tablet 6   Vitamin D, Ergocalciferol, (DRISDOL) 1.25 MG (50000 UNIT) CAPS capsule Take 50,000 Units by mouth every Wednesday.     No current facility-administered medications on file prior to visit.    Cardiovascular and other pertinent studies:  EKG 05/28/2021: Sinus rhythm 87 bpm Left axis deviation   Coronary angiography 05/07/2021:   Ost Cx to Prox Cx lesion is 40% stenosed.   Mid LAD lesion is 30% stenosed.   Prox RCA to Mid RCA lesion is 40% stenosed.   1.  Mild to moderate nonobstructive coronary artery disease. 2.  Left ventricular angiography was not performed.  EF was normal by echo. 3.  Moderately elevated left ventricular end-diastolic pressure at 22 mmHg.   Recommendations: Recommend aggressive medical therapy for nonobstructive coronary artery disease. Some of the patient's symptoms are likely related to diastolic heart failure due to uncontrolled hypertension and untreated sleep apnea. I increased the dose of Toprol and lisinopril.  I switched hydrochlorothiazide to small dose furosemide.  Echocardiogram 05/07/2021:  1. Left ventricular ejection fraction, by estimation, is 55 to 60%. The  left ventricle has normal function. The left ventricle has no regional  wall motion abnormalities. There is mild concentric left ventricular  hypertrophy. Left ventricular diastolic  parameters are indeterminate.   2. Right ventricular systolic function is normal. The right ventricular  size is normal. There is normal pulmonary artery systolic pressure.   3. The mitral valve is normal in structure. No evidence of mitral valve  regurgitation. No evidence of mitral stenosis.   4. The aortic valve is tricuspid. Aortic valve regurgitation is not  visualized. No aortic stenosis is present.   5. The inferior vena cava is normal in size with greater than 50%  respiratory variability, suggesting right atrial pressure of 3 mmHg.    Recent labs: 05/20/2021: Glucose 89, BUN/Cr 44/1.82. EGFR 30. Na/K 133/4.6.  H/H 10.5/33.8. MCV 89. Platelets 298 HbA1C 8.6% Chol 173, TG 68, HDL  54, LDL 105 TSH 5.1 elevated. Normal Free T4 BNP 234 (05/08/2021)    Review of Systems  Cardiovascular:  Positive for chest pain, claudication and dyspnea on exertion. Negative for leg swelling, palpitations and syncope.        Vitals:   05/28/21 1016 05/28/21 1026  BP: 140/63 135/63  Pulse: 87 92  Resp: 16   Temp: 97.9 F (36.6 C)   SpO2: 100%      Body mass index is 45.16 kg/m. Filed Weights   05/28/21 1016  Weight: 239 lb (108.4 kg)     Objective:   Physical Exam Vitals and nursing note reviewed.  Constitutional:      General: She is not in acute distress. Neck:     Vascular: No JVD.  Cardiovascular:     Rate and Rhythm: Normal rate and regular rhythm.     Pulses:          Dorsalis pedis pulses are 0 on the right side and 0 on the left side.       Posterior tibial pulses are 0 on the right side and 0 on the left side.     Heart sounds: Normal heart sounds. No murmur heard.    Comments: Femoral pulses are difficult to appreciate due to body habitus Pulmonary:     Effort: Pulmonary effort is normal.     Breath sounds: Normal breath sounds. No wheezing or rales.  Musculoskeletal:     Right lower leg: No edema.     Left lower leg: No edema.         Assessment &  Recommendations:    70 year old African-American female with hypertension, type 2 diabetes mellitus, nonobstructive CAD, CKD stage IIIb, HFpEF, recent recurrent hospital admissions with flash pulmonary edema.   HFpEF: Likely related to uncontrolled hypertension, diabetes, obesity. Counseled regarding low-salt diet.  No added salt.  Limit daily salt intake to 2 g or less. Continue current management for hypertension and diabetes. Given her CKD, I would avoid Entresto.  She is tolerating lisinopril and spironolactone fairly well.  Claudication: Suspect PAD.  Continue aspirin.  Increase Crestor to 40 mg daily. Not a candidate for Pletal given her history of heart failure. Encouraged regular walking.  Repeat lipid panel and follow-up in 3 months.   Thank you for referring the patient to [place]. Please feel free to contact with any questions.   Nigel Mormon, MD Pager: [phone] Office: [phone]

## 2021-05-28 ENCOUNTER — Ambulatory Visit: Payer: Medicare HMO | Admitting: Cardiology

## 2021-05-28 ENCOUNTER — Encounter: Payer: Self-pay | Admitting: Cardiology

## 2021-05-28 ENCOUNTER — Other Ambulatory Visit: Payer: Self-pay

## 2021-05-28 VITALS — BP 135/63 | HR 92 | Temp 97.9°F | Resp 16 | Ht 61.0 in | Wt 239.0 lb

## 2021-05-28 DIAGNOSIS — E782 Mixed hyperlipidemia: Secondary | ICD-10-CM

## 2021-05-28 DIAGNOSIS — I5032 Chronic diastolic (congestive) heart failure: Secondary | ICD-10-CM

## 2021-05-28 DIAGNOSIS — I10 Essential (primary) hypertension: Secondary | ICD-10-CM

## 2021-05-28 DIAGNOSIS — I5030 Unspecified diastolic (congestive) heart failure: Secondary | ICD-10-CM

## 2021-05-28 DIAGNOSIS — I739 Peripheral vascular disease, unspecified: Secondary | ICD-10-CM | POA: Insufficient documentation

## 2021-05-28 MED ORDER — ROSUVASTATIN CALCIUM 40 MG PO TABS: 20.0000 mg | ORAL_TABLET | Freq: Every day | ORAL | 3 refills | Status: DC

## 2021-05-29 ENCOUNTER — Ambulatory Visit: Payer: Medicare HMO | Admitting: Physician Assistant

## 2021-06-04 DIAGNOSIS — R0683 Snoring: Secondary | ICD-10-CM | POA: Insufficient documentation

## 2021-06-05 ENCOUNTER — Other Ambulatory Visit: Payer: Self-pay

## 2021-06-05 ENCOUNTER — Ambulatory Visit: Payer: Medicare HMO

## 2021-06-05 DIAGNOSIS — I739 Peripheral vascular disease, unspecified: Secondary | ICD-10-CM

## 2021-06-19 NOTE — Progress Notes (Signed)
Called patient, Charlotte Stuart, LMAM

## 2021-06-23 NOTE — Progress Notes (Signed)
Pt aware.

## 2021-06-23 NOTE — Progress Notes (Signed)
Called pt, no answer. Left vm requesting call back?

## 2021-08-08 ENCOUNTER — Ambulatory Visit: Payer: Medicare HMO | Admitting: Podiatry

## 2021-08-08 ENCOUNTER — Telehealth: Payer: Self-pay | Admitting: Podiatry

## 2021-08-08 NOTE — Telephone Encounter (Signed)
Pt called back and left message stating per the pcp it needed to be faxed to Dr Ocie Cornfield.   I returned call and Dr Roanna Raider is the doctor that treats her diabetes and I told her I would fax it there now. She said thank you.

## 2021-08-08 NOTE — Telephone Encounter (Signed)
Pt left message checking on status of her shoes.  Upon checking we have not received the needed documents from the pcp.   She is aware and is calling the pcp office.

## 2021-09-11 NOTE — Progress Notes (Signed)
Rescheduled

## 2021-09-12 ENCOUNTER — Ambulatory Visit: Payer: Medicare HMO | Admitting: Cardiology

## 2021-09-12 DIAGNOSIS — E782 Mixed hyperlipidemia: Secondary | ICD-10-CM

## 2021-09-12 DIAGNOSIS — I10 Essential (primary) hypertension: Secondary | ICD-10-CM

## 2021-09-12 DIAGNOSIS — I5032 Chronic diastolic (congestive) heart failure: Secondary | ICD-10-CM

## 2021-09-12 DIAGNOSIS — I739 Peripheral vascular disease, unspecified: Secondary | ICD-10-CM

## 2021-10-03 ENCOUNTER — Ambulatory Visit: Payer: Self-pay | Admitting: Cardiology

## 2021-10-03 NOTE — Progress Notes (Signed)
No show

## 2021-10-10 ENCOUNTER — Ambulatory Visit (INDEPENDENT_AMBULATORY_CARE_PROVIDER_SITE_OTHER): Payer: Medicare HMO | Admitting: Podiatry

## 2021-10-10 ENCOUNTER — Other Ambulatory Visit: Payer: Self-pay

## 2021-10-10 DIAGNOSIS — L84 Corns and callosities: Secondary | ICD-10-CM

## 2021-10-10 DIAGNOSIS — M2141 Flat foot [pes planus] (acquired), right foot: Secondary | ICD-10-CM

## 2021-10-10 DIAGNOSIS — E1142 Type 2 diabetes mellitus with diabetic polyneuropathy: Secondary | ICD-10-CM

## 2021-10-10 DIAGNOSIS — M2142 Flat foot [pes planus] (acquired), left foot: Secondary | ICD-10-CM

## 2021-10-10 DIAGNOSIS — B351 Tinea unguium: Secondary | ICD-10-CM | POA: Diagnosis not present

## 2021-10-10 DIAGNOSIS — M79676 Pain in unspecified toe(s): Secondary | ICD-10-CM | POA: Diagnosis not present

## 2021-10-10 DIAGNOSIS — E119 Type 2 diabetes mellitus without complications: Secondary | ICD-10-CM

## 2021-10-16 ENCOUNTER — Other Ambulatory Visit: Payer: Self-pay | Admitting: Internal Medicine

## 2021-10-16 DIAGNOSIS — Z1231 Encounter for screening mammogram for malignant neoplasm of breast: Secondary | ICD-10-CM

## 2021-10-18 ENCOUNTER — Encounter: Payer: Self-pay | Admitting: Podiatry

## 2021-10-18 DIAGNOSIS — Z800 Family history of malignant neoplasm of digestive organs: Secondary | ICD-10-CM | POA: Insufficient documentation

## 2021-10-18 NOTE — Progress Notes (Signed)
ANNUAL DIABETIC FOOT EXAM  Subjective: Charlotte Stuart presents today for for annual diabetic foot examination.  Patient relates diagnosis of diabetes.  Patient denies any h/o foot wounds.  Patient denies any numbness, tingling, burning, or pins/needle sensation in feet.  Patient's blood sugar was 198 mg/dl today.   Risk factors: PAD, hyperlipidemia, h/o MI, HTN, CAD.  Alanson Aly, MD is patient's PCP. Last visit was the fall of 2022 per patient recall.  Past Medical History:  Diagnosis Date   CAD (coronary artery disease)    Diabetes mellitus without complication ([place])    GERD (gastroesophageal reflux disease)    Heart attack ([place]) 1992   Heart murmur    Hypercalcemia    Hyperlipidemia    Hypertension    Retinal detachment    right   Sensorineural hearing loss (SNHL) of right ear with unrestricted hearing of left ear    Vitamin D deficiency    Patient Active Problem List   Diagnosis Date Noted   Family history of colon cancer 10/18/2021   Snoring 06/04/2021   PAD (peripheral artery disease) ([place]) 05/28/2021   Respiratory failure ([place]) 05/10/2021   Heart failure with preserved ejection fraction ([place]) 05/10/2021   Flash pulmonary edema ([place]) 05/09/2021   Unstable angina ([place]) 05/07/2021   Chest pain 08/05/2020   Colon cancer screening 08/05/2020   Costochondritis 08/05/2020   Flatulence, eructation and gas pain 08/05/2020   Family history of malignant neoplasm of gastrointestinal tract 08/05/2020   Hiatal hernia 08/05/2020   Left lower quadrant pain 08/05/2020   Rectal bleeding 08/05/2020   Acute bilateral low back pain without sciatica 04/23/2020   Daytime somnolence 08/10/2017   Heart palpitations 04/19/2017   Retinal detachment, right 04/09/2017   DOE (dyspnea on exertion) 04/08/2017   Ear pain, bilateral 12/25/2016   Sensorineural hearing loss (SNHL) of right ear with unrestricted hearing of left ear 12/25/2016   Pulsatile tinnitus of right ear  12/22/2016   Heart murmur 10/22/2015   Neck pain 09/22/2015   Hypercalcemia 09/22/2015   Hyperlipidemia 09/22/2015   Insulin long-term use ([place]) 09/22/2015   Vitamin D deficiency 09/22/2015   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 06/26/2015   Allergic rhinitis 12/27/2013   Constipation 12/27/2013   Edema 12/27/2013   GERD (gastroesophageal reflux disease) 12/27/2013   Hyperlipemia 12/27/2013   Type 2 diabetes mellitus without complication, with long-term current use of insulin ([place]) 12/27/2013   DIABETES MELLITUS, TYPE II 07/14/2010   CORONARY ATHEROSCLEROSIS NATIVE CORONARY ARTERY 05/14/2010   REDNESS OR DISCHARGE OF EYE 08/02/2007   CONTACT DERMATITIS 08/02/2007   OBESITY 06/14/2007   Essential hypertension 06/14/2007   GASTROESOPHAGEAL REFLUX DISEASE, HX OF 06/14/2007   Past Surgical History:  Procedure Laterality Date   BREAST BIOPSY     LEFT HEART CATH AND CORONARY ANGIOGRAPHY N/A 05/07/2021   Procedure: LEFT HEART CATH AND CORONARY ANGIOGRAPHY;  Surgeon: Wellington Hampshire, MD;  Location: [facility];  Service: Cardiovascular;  Laterality: N/A;   Current Outpatient Medications on File Prior to Visit  Medication Sig Dispense Refill   furosemide (LASIX) 20 MG tablet Take 0.5 tablet by mouth once daily     lisinopril (ZESTRIL) 20 MG tablet Take 1 tablet by mouth daily.     acetaminophen (TYLENOL) 500 MG tablet Take 1,000 mg by mouth every 6 (six) hours as needed for moderate pain.     aspirin EC 81 MG tablet Take 81 mg by mouth daily.     BD  PEN NEEDLE NANO U/F 32G X 4 MM MISC U UTD WITH VICTOZA  5   Carboxymethylcellulose Sodium (ARTIFICIAL TEARS OP) Place 1 drop into both eyes in the morning, at noon, in the evening, and at bedtime.     carvedilol (COREG) 12.5 MG tablet Take 1 tablet (12.5 mg total) by mouth 2 (two) times daily with a meal. 60 tablet 6   Continuous Blood Gluc Receiver (FREESTYLE LIBRE READER) DEVI Scan as needed up to 3 times per day  as directed. Dx E11.65     Continuous Blood Gluc Sensor (FREESTYLE LIBRE 14 DAY SENSOR) MISC FreeStyle Libre 14 Day Sensor kit     Continuous Blood Gluc Sensor ([place]) MISC Inject into the skin.     empagliflozin (JARDIANCE) 10 MG TABS tablet Take 1 tablet (10 mg total) by mouth daily. 30 tablet 6   glucose blood test strip Accu-Chek SmartView Test Strips  TEST THREE TIMES DAILY AS DIRECTED.     insulin regular human CONCENTRATED (HUMULIN R U-500 KWIKPEN) 500 UNIT/ML KwikPen Inject 50-100 Units into the skin 2 (two) times daily with a meal. 100 units in AM and 50 units in PM     Insulin Syringe-Needle U-100 (INSULIN SYRINGE .5CC/31GX5/16") 31G X 5/16" 0.5 ML MISC      Insulin Syringe-Needle U-100 (INSULIN SYRINGE 1CC/31GX5/16") 31G X 5/16" 1 ML MISC      linaclotide (LINZESS) 145 MCG CAPS capsule Take 145 mcg by mouth daily.     liraglutide (VICTOZA) 18 MG/3ML SOPN Inject 1.8 mg into the skin daily.     lisinopril (ZESTRIL) 20 MG tablet Take 20 mg by mouth daily.     lisinopril (ZESTRIL) 40 MG tablet Take 1 tablet (40 mg total) by mouth daily. 30 tablet 6   omeprazole (PRILOSEC) 40 MG capsule Take 40 mg by mouth daily.     OZEMPIC, 0.25 OR 0.5 MG/DOSE, 2 MG/1.5ML SOPN Inject into the skin.     PAXLOVID, 300/100, 20 x 150 MG & 10 x 100MG TBPK See admin instructions. follow package directions     promethazine-dextromethorphan (PROMETHAZINE-DM) 6.25-15 MG/5ML syrup Take 5 mLs by mouth every 4 (four) hours as needed.     rosuvastatin (CRESTOR) 40 MG tablet Take 0.5 tablets (20 mg total) by mouth daily. 90 tablet 3   spironolactone (ALDACTONE) 25 MG tablet Take 1 tablet (25 mg total) by mouth daily. (Patient taking differently: Take 12.5 mg by mouth daily.) 30 tablet 6   torsemide (DEMADEX) 20 MG tablet Take 1 tablet (20 mg total) by mouth daily. 30 tablet 6   Vitamin D, Ergocalciferol, (DRISDOL) 1.25 MG (50000 UNIT) CAPS capsule Take 50,000 Units by mouth every Wednesday.      No current facility-administered medications on file prior to visit.    Allergies  Allergen Reactions   Iodinated Contrast Media Itching and Swelling    Burning, swelling burns   Social History   Occupational History   Occupation: disability  Tobacco Use   Smoking status: Never   Smokeless tobacco: Former    Types: Snuff    Quit date: 12/2020   Tobacco comments:    Snuff x 30 years  Vaping Use   Vaping Use: Never used  Substance and Sexual Activity   Alcohol use: Not Currently   Drug use: Never   Sexual activity: Not on file   Family History  Problem Relation Age of Onset   Heart attack Father    Hypertension Sister    Diabetes  Sister    Hypertension Brother    Diabetes Brother    Hypertension Brother    Diabetes Brother    Hypertension Brother    Diabetes Brother    Immunization History  Administered Date(s) Administered   Fluad Quad(high Dose 65+) 05/08/2021   Influenza Whole 07/14/2010   Pfizer Covid-19 Vaccine Bivalent Booster 20yr & up 05/08/2021     Review of Systems: Negative except as noted in the HPI.   Objective: There were no vitals filed for this visit.  Charlotte MCWHIRTERis a pleasant 71y.o. female in NAD. AAO X 3.  Vascular Examination: Capillary refill time to digits immediate b/l. Palpable pedal pulses b/l LE. Pedal hair present. No pain with calf compression b/l. Lower extremity skin temperature gradient within normal limits. Nonpitting edema noted BLE. No ischemia or gangrene noted b/l LE. No cyanosis or clubbing noted b/l LE.  Dermatological Examination: Pedal integument with normal turgor, texture and tone b/l LE. No open wounds b/l. No interdigital macerations b/l. Toenails 1-5 b/l elongated, thickened, discolored with subungual debris. +Tenderness with dorsal palpation of nailplates. Hyperkeratotic lesion(s) noted posteromedial aspect of right heel.  Musculoskeletal Examination: Muscle strength 5/5 to all lower extremity muscle  groups bilaterally. No pain, crepitus or joint limitation noted with ROM bilateral LE. Pes planus deformity noted bilateral LE.  Footwear Assessment: Does the patient wear appropriate shoes? Yes. Does the patient need inserts/orthotics? No.  Neurological Examination: Protective sensation diminished with 10g monofilament b/l.  Hemoglobin A1C Latest Ref Rng & Units 05/07/2021  HGBA1C 4.8 - 5.6 % 8.6(H)  Some recent data might be hidden   Assessment: 1. Pain due to onychomycosis of toenail   2. Callus   3. Pes planus of both feet   4. Diabetic peripheral neuropathy associated with type 2 diabetes mellitus ([place]   5. Encounter for diabetic foot exam ([place]     ADA Risk Categorization: High Risk  Patient has one or more of the following: Loss of protective sensation Absent pedal pulses Severe Foot deformity History of foot ulcer  Plan: -Diabetic foot examination performed today. -Continue foot and shoe inspections daily. Monitor blood glucose per PCP/Endocrinologist's recommendations. -Mycotic toenails 1-5 bilaterally were debrided in length and girth with sterile nail nippers and dremel without incident. -Callus(es) posteromedial aspect of right heel pared utilizing sterile scalpel blade without complication or incident. Total number debrided =1. -Patient/POA to call should there be question/concern in the interim.  Return in about 3 months (around 01/07/2022).  JMarzetta Board DPM

## 2021-10-29 DIAGNOSIS — N1831 Chronic kidney disease, stage 3a: Secondary | ICD-10-CM | POA: Insufficient documentation

## 2021-10-29 DIAGNOSIS — R7989 Other specified abnormal findings of blood chemistry: Secondary | ICD-10-CM | POA: Insufficient documentation

## 2021-10-31 ENCOUNTER — Ambulatory Visit
Admission: RE | Admit: 2021-10-31 | Discharge: 2021-10-31 | Disposition: A | Discharge status: Home / Self Care | Payer: Medicare HMO | Source: Ambulatory Visit | Attending: Internal Medicine | Admitting: Internal Medicine

## 2021-10-31 ENCOUNTER — Other Ambulatory Visit: Payer: Self-pay | Admitting: Internal Medicine

## 2021-10-31 DIAGNOSIS — Z1231 Encounter for screening mammogram for malignant neoplasm of breast: Secondary | ICD-10-CM

## 2021-11-03 ENCOUNTER — Other Ambulatory Visit: Payer: Self-pay | Admitting: Internal Medicine

## 2021-11-03 DIAGNOSIS — R928 Other abnormal and inconclusive findings on diagnostic imaging of breast: Secondary | ICD-10-CM

## 2021-12-10 ENCOUNTER — Other Ambulatory Visit: Payer: Self-pay | Admitting: Internal Medicine

## 2021-12-10 ENCOUNTER — Ambulatory Visit
Admission: RE | Admit: 2021-12-10 | Discharge: 2021-12-10 | Disposition: A | Discharge status: Home / Self Care | Payer: Medicare HMO | Source: Ambulatory Visit | Attending: Internal Medicine | Admitting: Internal Medicine

## 2021-12-10 DIAGNOSIS — R921 Mammographic calcification found on diagnostic imaging of breast: Secondary | ICD-10-CM

## 2021-12-10 DIAGNOSIS — R928 Other abnormal and inconclusive findings on diagnostic imaging of breast: Secondary | ICD-10-CM

## 2022-01-09 ENCOUNTER — Encounter: Payer: Self-pay | Admitting: Podiatry

## 2022-01-09 ENCOUNTER — Ambulatory Visit (INDEPENDENT_AMBULATORY_CARE_PROVIDER_SITE_OTHER): Payer: Medicare HMO | Admitting: Podiatry

## 2022-01-09 DIAGNOSIS — M79676 Pain in unspecified toe(s): Secondary | ICD-10-CM

## 2022-01-09 DIAGNOSIS — E1142 Type 2 diabetes mellitus with diabetic polyneuropathy: Secondary | ICD-10-CM

## 2022-01-09 DIAGNOSIS — L84 Corns and callosities: Secondary | ICD-10-CM | POA: Diagnosis not present

## 2022-01-09 DIAGNOSIS — B351 Tinea unguium: Secondary | ICD-10-CM | POA: Diagnosis not present

## 2022-01-16 NOTE — Progress Notes (Signed)
  Subjective:  Patient ID: Charlotte Stuart, female    DOB: 1950-11-27,  MRN: [id number]  [name] presents to clinic today for at risk foot care with history of diabetic neuropathy and painful elongated mycotic toenails 1-5 bilaterally which are tender when wearing enclosed shoe gear. Pain is relieved with periodic professional debridement.  Patient states blood glucose was 142 mg/dl today.  Last known HgA1c was 8.1%.  New problem(s): None.   PCP is Cheron Schaumann., MD , and last visit was Dec 24, 2021.  She is awaiting delivery of her diabetic shoes.  Allergies  Allergen Reactions   Iodinated Contrast Media Itching and Swelling    Burning, swelling burns   Review of Systems: Negative except as noted in the HPI.  Objective: No changes noted in today's physical examination. There were no vitals filed for this visit.  Charlotte Stuart is a pleasant 71 y.o. female in NAD. AAO X 3.  Vascular Examination: Capillary refill time to digits immediate b/l. Palpable pedal pulses b/l LE. Pedal hair present. No pain with calf compression b/l. Lower extremity skin temperature gradient within normal limits. Nonpitting edema noted BLE. No ischemia or gangrene noted b/l LE. No cyanosis or clubbing noted b/l LE.  Dermatological Examination: Pedal integument with normal turgor, texture and tone b/l LE. No open wounds b/l. No interdigital macerations b/l. Toenails 1-5 b/l elongated, thickened, discolored with subungual debris. +Tenderness with dorsal palpation of nailplates. Hyperkeratotic lesion(s) noted posteromedial aspect of right heel.  Musculoskeletal Examination: Muscle strength 5/5 to all lower extremity muscle groups bilaterally. No pain, crepitus or joint limitation noted with ROM bilateral LE. Pes planus deformity noted bilateral LE. Patient uses cane for ambulation assistance.  Neurological Examination: Protective sensation diminished with 10g monofilament b/l.      Latest Ref Rng & Units 05/07/2021    6:43 AM  Hemoglobin A1C  Hemoglobin-A1c 4.8 - 5.6 % 8.6     Assessment/Plan: 1. Pain due to onychomycosis of toenail   2. Callus   3. Diabetic peripheral neuropathy associated with type 2 diabetes mellitus (HCC)     -Consent given for treatment as described below: -Continue foot and shoe inspections daily. Monitor blood glucose per PCP/Endocrinologist's recommendations. -Toenails 1-5 b/l were debrided in length and girth with sterile nail nippers and dremel without iatrogenic bleeding.  -Callus(es) posteromedial aspect of right heel pared utilizing sterile scalpel blade without complication or incident. Total number debrided =1. -Patient/POA to call should there be question/concern in the interim.   Return in about 3 months (around 04/11/2022).  Freddie Breech, DPM

## 2022-01-29 ENCOUNTER — Telehealth: Payer: Self-pay

## 2022-01-29 NOTE — Telephone Encounter (Signed)
CMN Received - Patient is ready to be evaluated for shoes. LVM for patient to schedule eval appointment

## 2022-02-14 ENCOUNTER — Other Ambulatory Visit: Payer: Self-pay

## 2022-02-14 ENCOUNTER — Emergency Department (HOSPITAL_BASED_OUTPATIENT_CLINIC_OR_DEPARTMENT_OTHER): Payer: Medicare HMO

## 2022-02-14 ENCOUNTER — Emergency Department (HOSPITAL_BASED_OUTPATIENT_CLINIC_OR_DEPARTMENT_OTHER)
Admission: EM | Admit: 2022-02-14 | Discharge: 2022-02-14 | Disposition: A | Discharge status: Home / Self Care | Payer: Medicare HMO | Attending: Emergency Medicine | Admitting: Emergency Medicine

## 2022-02-14 DIAGNOSIS — Z7982 Long term (current) use of aspirin: Secondary | ICD-10-CM | POA: Diagnosis not present

## 2022-02-14 DIAGNOSIS — Z79899 Other long term (current) drug therapy: Secondary | ICD-10-CM | POA: Insufficient documentation

## 2022-02-14 DIAGNOSIS — Z794 Long term (current) use of insulin: Secondary | ICD-10-CM | POA: Insufficient documentation

## 2022-02-14 DIAGNOSIS — K5900 Constipation, unspecified: Secondary | ICD-10-CM | POA: Insufficient documentation

## 2022-02-14 LAB — LIPASE, BLOOD: Lipase: 22 U/L (ref 11–51)

## 2022-02-14 LAB — COMPREHENSIVE METABOLIC PANEL
ALT: 17 U/L (ref 0–44)
AST: 18 U/L (ref 15–41)
Albumin: 3.8 g/dL (ref 3.5–5.0)
Alkaline Phosphatase: 98 U/L (ref 38–126)
Anion gap: 5 (ref 5–15)
BUN: 16 mg/dL (ref 8–23)
CO2: 25 mmol/L (ref 22–32)
Calcium: 9.4 mg/dL (ref 8.9–10.3)
Chloride: 112 mmol/L — ABNORMAL HIGH (ref 98–111)
Creatinine, Ser: 0.73 mg/dL (ref 0.44–1.00)
GFR, Estimated: >60 mL/min (ref >60)
Glucose, Bld: 163 mg/dL — ABNORMAL HIGH (ref 70–99)
Potassium: 3.5 mmol/L (ref 3.5–5.1)
Sodium: 142 mmol/L (ref 135–145)
Total Bilirubin: 0.2 mg/dL — ABNORMAL LOW (ref 0.3–1.2)
Total Protein: 7.4 g/dL (ref 6.5–8.1)

## 2022-02-14 LAB — CBC WITH DIFFERENTIAL/PLATELET
Abs Immature Granulocytes: 0.01 10*3/uL (ref 0.00–0.07)
Basophils Absolute: 0 10*3/uL (ref 0.0–0.1)
Basophils Relative: 0 %
Eosinophils Absolute: 0.1 10*3/uL (ref 0.0–0.5)
Eosinophils Relative: 1 %
HCT: 36.7 % (ref 36.0–46.0)
Hemoglobin: 11.8 g/dL — ABNORMAL LOW (ref 12.0–15.0)
Immature Granulocytes: 0 %
Lymphocytes Relative: 37 %
Lymphs Abs: 2.5 10*3/uL (ref 0.7–4.0)
MCH: 27.5 pg (ref 26.0–34.0)
MCHC: 32.2 g/dL (ref 30.0–36.0)
MCV: 85.5 fL (ref 80.0–100.0)
Monocytes Absolute: 0.5 10*3/uL (ref 0.1–1.0)
Monocytes Relative: 7 %
Neutro Abs: 3.8 10*3/uL (ref 1.7–7.7)
Neutrophils Relative %: 55 %
Platelets: 309 10*3/uL (ref 150–400)
RBC: 4.29 MIL/uL (ref 3.87–5.11)
RDW: 14.3 % (ref 11.5–15.5)
WBC: 6.9 10*3/uL (ref 4.0–10.5)
nRBC: 0 % (ref 0.0–0.2)

## 2022-02-14 MED ORDER — FLEET ENEMA 7-19 GM/118ML RE ENEM
1.0000 | ENEMA | Freq: Once | RECTAL | Status: AC
Start: 2022-02-14 — End: 2022-02-14
  Administered 2022-02-14: 1 via RECTAL
  Filled 2022-02-14: qty 1

## 2022-02-14 MED ORDER — FLEET ENEMA 7-19 GM/118ML RE ENEM: 1.0000 | ENEMA | Freq: Once | RECTAL | 0 refills | Status: AC

## 2022-02-14 MED ORDER — GOLYTELY 236 G PO SOLR: 4000.0000 mL | Freq: Once | ORAL | 0 refills | Status: AC

## 2022-03-31 ENCOUNTER — Other Ambulatory Visit (HOSPITAL_COMMUNITY): Payer: Self-pay

## 2022-04-07 ENCOUNTER — Telehealth: Payer: Self-pay | Admitting: Podiatry

## 2022-04-07 NOTE — Telephone Encounter (Signed)
Spoke with patient about her diabetic shoe order. Informed patient that I do not see an appointment where shoes were chosen or where the impression was done for her custom inserts. In order to correct the mistake I offered to see the patient when she comes in next Friday 04/17/2022 to see Dr Eloy End and start the ordering process. Patient was appreciative and stated that she would like to see me when she comes in and I then advised her that I would resend CMN if it expired before her order came in and she was satisfied with all.

## 2022-04-17 ENCOUNTER — Ambulatory Visit (INDEPENDENT_AMBULATORY_CARE_PROVIDER_SITE_OTHER): Payer: Medicare HMO | Admitting: Podiatry

## 2022-04-17 ENCOUNTER — Encounter: Payer: Self-pay | Admitting: Podiatry

## 2022-04-17 DIAGNOSIS — M79676 Pain in unspecified toe(s): Secondary | ICD-10-CM

## 2022-04-17 DIAGNOSIS — E1142 Type 2 diabetes mellitus with diabetic polyneuropathy: Secondary | ICD-10-CM

## 2022-04-17 DIAGNOSIS — B351 Tinea unguium: Secondary | ICD-10-CM

## 2022-04-23 NOTE — Progress Notes (Signed)
  Subjective:  Patient ID: Charlotte Stuart, female    DOB: [birth year],  MRN: [id number]  71 y.o. female presents with at risk foot care with history of diabetic neuropathy and callus(es) right lower extremity and painful thick toenails that are difficult to trim. Painful toenails interfere with ambulation. Aggravating factors include wearing enclosed shoe gear. Pain is relieved with periodic professional debridement. Painful calluses are aggravated when weightbearing with and without shoegear. Pain is relieved with periodic professional debridement..    Patient states blood glucose was 169 mg/dl today. Last known  HgA1c was 8.1%.    New problem(s): None    PCP: Cheron Schaumann., MD and last visit was: Jan 16, 2022.  Review of Systems: Negative except as noted in the HPI.   Allergies  Allergen Reactions   Iodinated Contrast Media Itching and Swelling    Burning, swelling burns    Objective:  There were no vitals filed for this visit. Constitutional Patient is a pleasant 71 y.o. African American female obese in NAD. AAO x 3.  Vascular Capillary fill time to digits immediate b/l.  DP/PT pulse(s) are palpable b/l lower extremities. Pedal hair sparse. Lower extremity skin temperature gradient within normal limits. No pain with calf compression b/l. No edema noted b/l lower extremities. No cyanosis or clubbing noted. Nonpitting edema noted BLE.  Neurologic Protective sensation diminished with 10g monofilament b/l.  Dermatologic Pedal skin is warm and supple b/l.  No open wounds b/l lower extremities. No interdigital macerations b/l lower extremities. Toenails 1-5 b/l elongated, discolored, dystrophic, thickened, crumbly with subungual debris and tenderness to dorsal palpation. No hyperkeratotic nor porokeratotic lesions present on today's visit.  Orthopedic: Normal muscle strength 5/5 to all lower extremity muscle groups bilaterally. Patient ambulates independent of any assistive aids. Pes  planus deformity noted bilateral LE.      Latest Ref Rng & Units 05/07/2021    6:43 AM  Hemoglobin A1C  Hemoglobin-A1c 4.8 - 5.6 % 8.6    Assessment:   1. Pain due to onychomycosis of toenail   2. Diabetic peripheral neuropathy associated with type 2 diabetes mellitus (HCC)    Plan:  Patient was evaluated and treated and all questions answered. Consent given for treatment as described below: -Examined patient. -Patient to continue soft, supportive shoe gear daily. -Mycotic toenails 1-5 bilaterally were debrided in length and girth with sterile nail nippers and dremel without incident. -Patient/POA to call should there be question/concern in the interim.  Return in about 3 months (around 07/18/2022).  Freddie Breech, DPM

## 2022-06-03 DIAGNOSIS — Z86010 Personal history of colonic polyps: Secondary | ICD-10-CM | POA: Insufficient documentation

## 2022-06-20 ENCOUNTER — Encounter: Payer: Self-pay | Admitting: Podiatry

## 2022-07-01 ENCOUNTER — Ambulatory Visit
Admission: RE | Admit: 2022-07-01 | Discharge: 2022-07-01 | Disposition: A | Discharge status: Home / Self Care | Payer: Medicare HMO | Source: Ambulatory Visit | Attending: Internal Medicine | Admitting: Internal Medicine

## 2022-07-01 ENCOUNTER — Other Ambulatory Visit: Payer: Self-pay | Admitting: Internal Medicine

## 2022-07-01 DIAGNOSIS — R921 Mammographic calcification found on diagnostic imaging of breast: Secondary | ICD-10-CM

## 2022-07-06 ENCOUNTER — Telehealth: Payer: Self-pay | Admitting: Podiatry

## 2022-07-06 NOTE — Telephone Encounter (Signed)
Spoke with patient scheduled her for 07/08/22 11a

## 2022-07-06 NOTE — Telephone Encounter (Signed)
Left message on machine to call back to schedule pick up diabetic shoes

## 2022-07-08 ENCOUNTER — Ambulatory Visit (INDEPENDENT_AMBULATORY_CARE_PROVIDER_SITE_OTHER): Payer: Self-pay

## 2022-07-08 DIAGNOSIS — M2141 Flat foot [pes planus] (acquired), right foot: Secondary | ICD-10-CM

## 2022-07-08 DIAGNOSIS — M2142 Flat foot [pes planus] (acquired), left foot: Secondary | ICD-10-CM | POA: Diagnosis not present

## 2022-07-08 DIAGNOSIS — E1142 Type 2 diabetes mellitus with diabetic polyneuropathy: Secondary | ICD-10-CM

## 2022-07-08 NOTE — Progress Notes (Addendum)
Patient presents to the office today with issues concerning the diabetic shoes picked up on 07/08/22.   The shoes feel fine.  We will send the diabetic inserts back to have the right size and support added.    Patient kept the shoes. Advised to bring back a pair to check fit of reorder.  Patient will be notified for a fitting appointment once the diabetic in arrive in office.   *Patient picked up adjusted orthotics January 2024.

## 2022-07-14 NOTE — Progress Notes (Unsigned)
Patient referred by Loraine Leriche.,* for HFpEF  Subjective:   Charlotte Stuart, female    DOB: Apr 10, 1951, 71 y.o.   MRN: [id number]   No chief complaint on file.    HPI  71 year old African-American female with hypertension, type 2 diabetes mellitus, HFpEF, PAD  Patient was last seen by me in 05/2021. LEA duplex performed at that had suggested bilateral SFA occlusion. ***  ***  Initial consultation visit 05/2021: Patient is here today to establish care with me. She was recently hospitalized with dyspnea, chest pai, fatigue. Cath showed nonobstructive CAD. Echocardiogram showed normal EF, did not show any major diastolic dysfunction.  However, BNP was elevated.  She was treated for HFpEF.   Patient is here with her daughter today.  She still uses half a tablespoon added salt on her plate, in addition to salt used in cooking.  She does report exertional dyspnea and occasional chest pain.  In addition, she reports generalized fatigue and claudication.  She sees a podiatrist regularly.  She does not see a nephrologist.  I personally reviewed her coronary angiogram and echocardiogram performed during recent hospitalization.   Current Outpatient Medications:    acetaminophen (TYLENOL) 500 MG tablet, Take 1,000 mg by mouth every 6 (six) hours as needed for moderate pain., Disp: , Rfl:    aspirin EC 81 MG tablet, Take 1 tablet by mouth daily., Disp: , Rfl:    atorvastatin (LIPITOR) 20 MG tablet, Take 1 tablet every day by oral route., Disp: , Rfl:    BD PEN NEEDLE NANO U/F 32G X 4 MM MISC, U UTD WITH VICTOZA, Disp: , Rfl: 5   Carboxymethylcellulose Sodium (ARTIFICIAL TEARS OP), Place 1 drop into both eyes in the morning, at noon, in the evening, and at bedtime., Disp: , Rfl:    carvedilol (COREG) 12.5 MG tablet, Take 1 tablet (12.5 mg total) by mouth 2 (two) times daily with a meal., Disp: 60 tablet, Rfl: 6   clotrimazole-betamethasone (LOTRISONE) cream, APPLY TO BOTH FEET AND  BETWEEN TOES TWICE DAILY FOR 4 WEEKS, Disp: , Rfl:    Continuous Blood Gluc Receiver (FREESTYLE LIBRE READER) DEVI, Scan as needed up to 3 times per day as directed. Dx E11.65, Disp: , Rfl:    Continuous Blood Gluc Sensor (FREESTYLE LIBRE 14 DAY SENSOR) MISC, FreeStyle Libre 14 Day Sensor kit, Disp: , Rfl:    Continuous Blood Gluc Sensor (FREESTYLE LIBRE SENSOR SYSTEM) MISC, Inject into the skin., Disp: , Rfl:    dorzolamide-timolol (COSOPT) 22.3-6.8 MG/ML ophthalmic solution, Place 1 drop into the right eye 2 (two) times daily., Disp: , Rfl:    empagliflozin (JARDIANCE) 10 MG TABS tablet, Take 1 tablet (10 mg total) by mouth daily., Disp: 30 tablet, Rfl: 6   erythromycin ophthalmic ointment, APPLY INTO LEFT EYE TWICE DAILY UNTIL NEXT APPOINTMENT, Disp: , Rfl:    furosemide (LASIX) 20 MG tablet, Take 0.5 tablet by mouth once daily, Disp: , Rfl:    glucose blood test strip, Accu-Chek SmartView Test Strips  TEST THREE TIMES DAILY AS DIRECTED., Disp: , Rfl:    insulin regular human CONCENTRATED (HUMULIN R U-500 KWIKPEN) 500 UNIT/ML KwikPen, Inject 50-100 Units into the skin 2 (two) times daily with a meal. 100 units in AM and 50 units in PM, Disp: , Rfl:    Insulin Syringe-Needle U-100 (INSULIN SYRINGE .5CC/31GX5/16") 31G X 5/16" 0.5 ML MISC, , Disp: , Rfl:    Insulin Syringe-Needle U-100 (INSULIN SYRINGE 1CC/31GX5/16") 31G X 5/16" 1  ML MISC, , Disp: , Rfl:    lactulose (CHRONULAC) 10 GM/15ML solution, SMARTSIG:30 Milliliter(s) By Mouth Every 2 Hours, Disp: , Rfl:    linaclotide (LINZESS) 145 MCG CAPS capsule, Take 145 mcg by mouth daily., Disp: , Rfl:    liraglutide (VICTOZA) 18 MG/3ML SOPN, Inject 1.8 mg into the skin daily., Disp: , Rfl:    lisinopril (ZESTRIL) 20 MG tablet, Take 20 mg by mouth daily., Disp: , Rfl:    lisinopril (ZESTRIL) 20 MG tablet, Take 1 tablet by mouth daily., Disp: , Rfl:    lisinopril (ZESTRIL) 40 MG tablet, Take 1 tablet (40 mg total) by mouth daily., Disp: 30 tablet, Rfl:  6   lisinopril-hydrochlorothiazide (ZESTORETIC) 10-12.5 MG tablet, Take 1 tablet by mouth daily., Disp: , Rfl:    metFORMIN (GLUCOPHAGE-XR) 500 MG 24 hr tablet, , Disp: , Rfl:    Metoprolol-hydroCHLOROthiazide (DUTOPROL PO), , Disp: , Rfl:    omeprazole (PRILOSEC) 40 MG capsule, Take 40 mg by mouth daily., Disp: , Rfl:    OZEMPIC, 0.25 OR 0.5 MG/DOSE, 2 MG/1.5ML SOPN, Inject into the skin., Disp: , Rfl:    PAXLOVID, 300/100, 20 x 150 MG & 10 x 100MG TBPK, See admin instructions. follow package directions, Disp: , Rfl:    polyethylene glycol-electrolytes (NULYTELY) 420 g solution, Take by mouth as directed., Disp: , Rfl:    promethazine-dextromethorphan (PROMETHAZINE-DM) 6.25-15 MG/5ML syrup, Take 5 mLs by mouth every 4 (four) hours as needed., Disp: , Rfl:    rosuvastatin (CRESTOR) 40 MG tablet, Take 0.5 tablets (20 mg total) by mouth daily., Disp: 90 tablet, Rfl: 3   spironolactone (ALDACTONE) 25 MG tablet, Take 1 tablet (25 mg total) by mouth daily. (Patient taking differently: Take 12.5 mg by mouth daily.), Disp: 30 tablet, Rfl: 6   torsemide (DEMADEX) 20 MG tablet, Take 1 tablet (20 mg total) by mouth daily., Disp: 30 tablet, Rfl: 6   TRULICITY 1.5 YI/5.0YD SOPN, Inject into the skin., Disp: , Rfl:    Vitamin D, Ergocalciferol, (DRISDOL) 1.25 MG (50000 UNIT) CAPS capsule, Take 50,000 Units by mouth every Wednesday., Disp: , Rfl:     Cardiovascular and other pertinent studies:  Lower Extremity Arterial Duplex 06/05/2021:  Right External Iliac artery stenosis of <50%.  Absent flow noted of the bilateral mid superficial femoral and distal  superficial femoral arteries.  This exam reveals normal perfusion of the right lower extremity (ABI 1.00)  and mildly decreased perfusion of the left lower extremity, noted at the  post tibial artery level (ABI 0.83). There is mildly abnormal biphasic  waveform pattern at level of right ankle and severely abnormal monophasic  waveform pattern at the  level of left ankle.  Compared to 07/10/2013, bilateral SFA occlusion is new.   EKG 05/28/2021: Sinus rhythm 87 bpm Left axis deviation   Coronary angiography 05/07/2021:   Ost Cx to Prox Cx lesion is 40% stenosed.   Mid LAD lesion is 30% stenosed.   Prox RCA to Mid RCA lesion is 40% stenosed.   1.  Mild to moderate nonobstructive coronary artery disease. 2.  Left ventricular angiography was not performed.  EF was normal by echo. 3.  Moderately elevated left ventricular end-diastolic pressure at 22 mmHg.   Recommendations: Recommend aggressive medical therapy for nonobstructive coronary artery disease. Some of the patient's symptoms are likely related to diastolic heart failure due to uncontrolled hypertension and untreated sleep apnea. I increased the dose of Toprol and lisinopril.  I switched hydrochlorothiazide to small dose  furosemide.  Echocardiogram 05/07/2021:  1. Left ventricular ejection fraction, by estimation, is 55 to 60%. The  left ventricle has normal function. The left ventricle has no regional  wall motion abnormalities. There is mild concentric left ventricular  hypertrophy. Left ventricular diastolic parameters are indeterminate.   2. Right ventricular systolic function is normal. The right ventricular  size is normal. There is normal pulmonary artery systolic pressure.   3. The mitral valve is normal in structure. No evidence of mitral valve  regurgitation. No evidence of mitral stenosis.   4. The aortic valve is tricuspid. Aortic valve regurgitation is not  visualized. No aortic stenosis is present.   5. The inferior vena cava is normal in size with greater than 50%  respiratory variability, suggesting right atrial pressure of 3 mmHg.    Recent labs: 02/14/2022: Glucose 163, BUN/Cr 16/0.73. EGFR >60. Na/K 142/3.5. Rest of the CMP normal H/H 11.8/36.7. MCV 855. Platelets 309  05/20/2021: Glucose 89, BUN/Cr 44/1.82. EGFR 30. Na/K 133/4.6.  H/H 10.5/33.8. MCV  89. Platelets 298 HbA1C 8.6% Chol 173, TG 68, HDL 54, LDL 105 TSH 5.1 elevated. Normal Free T4 BNP 234 (05/08/2021)    Review of Systems  Cardiovascular:  Positive for chest pain, claudication and dyspnea on exertion. Negative for leg swelling, palpitations and syncope.         There were no vitals filed for this visit.    There is no height or weight on file to calculate BMI. There were no vitals filed for this visit.    Objective:   Physical Exam Vitals and nursing note reviewed.  Constitutional:      General: She is not in acute distress. Neck:     Vascular: No JVD.  Cardiovascular:     Rate and Rhythm: Normal rate and regular rhythm.     Pulses:          Dorsalis pedis pulses are 0 on the right side and 0 on the left side.       Posterior tibial pulses are 0 on the right side and 0 on the left side.     Heart sounds: Normal heart sounds. No murmur heard.    Comments: Femoral pulses are difficult to appreciate due to body habitus Pulmonary:     Effort: Pulmonary effort is normal.     Breath sounds: Normal breath sounds. No wheezing or rales.  Musculoskeletal:     Right lower leg: No edema.     Left lower leg: No edema.          Assessment & Recommendations:    71 year old African-American female with hypertension, type 2 diabetes mellitus, nonobstructive CAD, CKD stage IIIb, HFpEF, recent recurrent hospital admissions with flash pulmonary edema.   *** HFpEF: Likely related to uncontrolled hypertension, diabetes, obesity. Counseled regarding low-salt diet.  No added salt.  Limit daily salt intake to 2 g or less. Continue current management for hypertension and diabetes. ***  PAD: LEA duplex in 05/2021 suggested bilateral SFA occlusion. *** Continue aspirin, Crestor to 40 mg daily. Not a candidate for Pletal given her history of heart failure. Encouraged regular walking. Check lipid panel ***  F/u in ***  Thank you for referring the patient to  [place]. Please feel free to contact with any questions.   Nigel Mormon, MD Pager: [phone] Office: [phone]

## 2022-07-15 ENCOUNTER — Encounter: Payer: Self-pay | Admitting: Cardiology

## 2022-07-15 ENCOUNTER — Ambulatory Visit: Payer: Medicare HMO | Admitting: Cardiology

## 2022-07-15 VITALS — BP 162/64 | HR 74 | Resp 16 | Ht 61.0 in | Wt 239.0 lb

## 2022-07-15 DIAGNOSIS — I10 Essential (primary) hypertension: Secondary | ICD-10-CM

## 2022-07-15 DIAGNOSIS — I739 Peripheral vascular disease, unspecified: Secondary | ICD-10-CM

## 2022-07-15 DIAGNOSIS — I5032 Chronic diastolic (congestive) heart failure: Secondary | ICD-10-CM

## 2022-07-22 ENCOUNTER — Ambulatory Visit: Payer: Medicare HMO | Admitting: Dermatology

## 2022-07-22 ENCOUNTER — Other Ambulatory Visit: Payer: Medicare HMO

## 2022-07-29 ENCOUNTER — Other Ambulatory Visit: Payer: Medicare HMO

## 2022-08-04 ENCOUNTER — Ambulatory Visit: Payer: Medicare HMO

## 2022-08-04 DIAGNOSIS — I10 Essential (primary) hypertension: Secondary | ICD-10-CM

## 2022-08-05 ENCOUNTER — Ambulatory Visit: Payer: Medicare HMO | Admitting: Podiatry

## 2022-08-07 ENCOUNTER — Other Ambulatory Visit: Payer: Self-pay | Admitting: Cardiology

## 2022-08-07 DIAGNOSIS — I739 Peripheral vascular disease, unspecified: Secondary | ICD-10-CM

## 2022-08-10 ENCOUNTER — Ambulatory Visit (INDEPENDENT_AMBULATORY_CARE_PROVIDER_SITE_OTHER): Payer: Medicare HMO | Admitting: Podiatry

## 2022-08-10 DIAGNOSIS — Z91199 Patient's noncompliance with other medical treatment and regimen due to unspecified reason: Secondary | ICD-10-CM

## 2022-08-11 ENCOUNTER — Telehealth: Payer: Self-pay

## 2022-08-11 NOTE — Progress Notes (Signed)
1. No-show for appointment     

## 2022-08-11 NOTE — Progress Notes (Signed)
Spoke with patient about results. The pt acknowledged understanding and had no further questions.

## 2022-08-11 NOTE — Progress Notes (Signed)
Called patient no answer left a vm

## 2022-08-11 NOTE — Telephone Encounter (Signed)
Initial follow-up after patient started home BP monitoring through [facility]. Spoke with patient for new start check in. She is doing well and has no complaints at this time.   BP is fairly controlled on current antihypertensive regimen. Up titration of medications may not be necessary at this time.   Average Systolic BP Level 132.29 mmHg Lowest Systolic BP Level 119 mmHg Highest Systolic BP Level 142 mmHg  08/10/2022 Monday at 04:20 PM 142 / 77      08/10/2022 Monday at 12:00 AM 139 / 81      08/08/2022 Saturday at 01:43 PM 123 / 68      08/07/2022 Friday at 05:50 PM 119 / 55      08/06/2022 Thursday at 09:29 PM 141 / 81      08/05/2022 Wednesday at 02:04 PM 126 / 63      08/04/2022 Tuesday at 11:48 AM 136 / 75

## 2022-08-12 NOTE — Telephone Encounter (Signed)
Agree  Thanks MJP  

## 2022-09-18 ENCOUNTER — Telehealth: Payer: Self-pay | Admitting: Podiatry

## 2022-09-18 NOTE — Telephone Encounter (Signed)
Tried to reach patient to schedule appt to pick up adjusted orthotics.  No answer no vm

## 2022-10-14 ENCOUNTER — Telehealth: Payer: Self-pay

## 2022-10-14 ENCOUNTER — Ambulatory Visit: Payer: Medicare HMO | Admitting: Cardiology

## 2022-10-14 ENCOUNTER — Encounter: Payer: Self-pay | Admitting: Cardiology

## 2022-10-14 VITALS — BP 183/65 | HR 84 | Resp 16 | Ht 61.0 in | Wt 236.0 lb

## 2022-10-14 DIAGNOSIS — I5032 Chronic diastolic (congestive) heart failure: Secondary | ICD-10-CM

## 2022-10-14 DIAGNOSIS — E782 Mixed hyperlipidemia: Secondary | ICD-10-CM

## 2022-10-14 DIAGNOSIS — I10 Essential (primary) hypertension: Secondary | ICD-10-CM

## 2022-10-14 DIAGNOSIS — I739 Peripheral vascular disease, unspecified: Secondary | ICD-10-CM

## 2022-10-14 NOTE — Telephone Encounter (Signed)
This morning's blood pressure reading was elevated.  10/14/22 10:54 AM 180 / 111 mmHg 101 bpm

## 2022-10-14 NOTE — Telephone Encounter (Signed)
Patient home BP is fairly controlled on current antihypertensive regimen.   Systolic Blood Pressure mmHg -- 140.6 (123456 - 123456) Diastolic Blood Pressure mmHg -- 72.8 (58.0 - 98.0) Heart Rate bpm -- 79.4 (71.0 - 86.0)  10/13/22 3:56 PM  133 / 58 mmHg 82 bpm  10/12/22 12:26 PM  150 / 98 mmHg 86 bpm  10/09/22 10:21 PM  152 / 79 mmHg 81 bpm  10/07/22 2:27 PM  152 / 77 mmHg 83 bpm  10/06/22 4:41 PM  142 / 70 mmHg 80 bpm  10/05/22 12:46 PM  148 / 75 mmHg 74 bpm  10/02/22 6:35 AM  164 / 86 mmHg 76 bpm  10/01/22 2:15 PM  124 / 63 mmHg 71 bpm  09/30/22 12:07 PM  130 / 64 mmHg 76 Bpm

## 2022-10-14 NOTE — Progress Notes (Signed)
Patient referred by Loraine Leriche.,* for HFpEF  Subjective:   Charlotte Stuart, female    DOB: [birth year], 72 y.o.   MRN: [id number]   Chief Complaint  Patient presents with   PAD   Follow-up    3 month     HPI  72 year old African-American female with hypertension, type 2 diabetes mellitus, HFpEF, PAD  Blood pressure elevated today, but has been better controlled recently.   Patient home BP is fairly controlled on current antihypertensive regimen.    Systolic Blood Pressure          mmHg  --          140.6 (123456 - 123456) Diastolic Blood Pressure        mmHg  --          72.8 (58.0 - 98.0) Heart Rate       bpm     --          79.4 (71.0 - 86.0)   10/13/22 3:56 PM                     133      /           58        mmHg  82        bpm      10/12/22 12:26 PM                   150      /           98        mmHg  86        bpm      10/09/22 10:21 PM                   152      /           79        mmHg  81        bpm      10/07/22 2:27 PM                     152      /           77        mmHg  83        bpm      10/06/22 4:41 PM                     142      /           70        mmHg  80        bpm      10/05/22 12:46 PM                   148      /           75        mmHg  74        bpm      10/02/22 6:35 AM                       164      /           86  mmHg  76        bpm      10/01/22 2:15 PM                       124      /           63        mmHg  71        bpm      09/30/22 12:07 PM                     130      /           64        mmHg  76        Bpm    Initial consultation visit 05/2021: Patient is here today to establish care with me. She was recently hospitalized with dyspnea, chest pai, fatigue. Cath showed nonobstructive CAD. Echocardiogram showed normal EF, did not show any major diastolic dysfunction.  However, BNP was elevated.  She was treated for HFpEF.   Patient is here with her daughter today.  She still uses half a tablespoon added salt on her  plate, in addition to salt used in cooking.  She does report exertional dyspnea and occasional chest pain.  In addition, she reports generalized fatigue and claudication.  She sees a podiatrist regularly.  She does not see a nephrologist.  I personally reviewed her coronary angiogram and echocardiogram performed during recent hospitalization.   Current Outpatient Medications:    acetaminophen (TYLENOL) 500 MG tablet, Take 1,000 mg by mouth every 6 (six) hours as needed for moderate pain., Disp: , Rfl:    aspirin EC 81 MG tablet, Take 1 tablet by mouth daily., Disp: , Rfl:    atorvastatin (LIPITOR) 20 MG tablet, Take 1 tablet every day by oral route., Disp: , Rfl:    BD PEN NEEDLE NANO U/F 32G X 4 MM MISC, U UTD WITH VICTOZA, Disp: , Rfl: 5   Carboxymethylcellulose Sodium (ARTIFICIAL TEARS OP), Place 1 drop into both eyes in the morning, at noon, in the evening, and at bedtime., Disp: , Rfl:    carvedilol (COREG) 12.5 MG tablet, Take 1 tablet (12.5 mg total) by mouth 2 (two) times daily with a meal., Disp: 60 tablet, Rfl: 6   clotrimazole-betamethasone (LOTRISONE) cream, APPLY TO BOTH FEET AND BETWEEN TOES TWICE DAILY FOR 4 WEEKS, Disp: , Rfl:    Continuous Blood Gluc Receiver (FREESTYLE LIBRE READER) DEVI, Scan as needed up to 3 times per day as directed. Dx E11.65, Disp: , Rfl:    Continuous Blood Gluc Sensor (FREESTYLE LIBRE 14 DAY SENSOR) MISC, FreeStyle Libre 14 Day Sensor kit, Disp: , Rfl:    Continuous Blood Gluc Sensor (FREESTYLE LIBRE SENSOR SYSTEM) MISC, Inject into the skin., Disp: , Rfl:    dorzolamide-timolol (COSOPT) 22.3-6.8 MG/ML ophthalmic solution, Place 1 drop into the right eye 2 (two) times daily., Disp: , Rfl:    erythromycin ophthalmic ointment, APPLY INTO LEFT EYE TWICE DAILY UNTIL NEXT APPOINTMENT, Disp: , Rfl:    furosemide (LASIX) 20 MG tablet, Take 0.5 tablet by mouth once daily, Disp: , Rfl:    glucose blood test strip, Accu-Chek SmartView Test Strips  TEST THREE TIMES  DAILY AS DIRECTED., Disp: , Rfl:    insulin regular human CONCENTRATED (HUMULIN R U-500 KWIKPEN) 500 UNIT/ML KwikPen, Inject 50-100 Units into the skin 2 (two) times  daily with a meal. 100 units in AM and 50 units in PM, Disp: , Rfl:    Insulin Syringe-Needle U-100 (INSULIN SYRINGE .5CC/31GX5/16") 31G X 5/16" 0.5 ML MISC, , Disp: , Rfl:    Insulin Syringe-Needle U-100 (INSULIN SYRINGE 1CC/31GX5/16") 31G X 5/16" 1 ML MISC, , Disp: , Rfl:    lactulose (CHRONULAC) 10 GM/15ML solution, SMARTSIG:30 Milliliter(s) By Mouth Every 2 Hours, Disp: , Rfl:    linaclotide (LINZESS) 145 MCG CAPS capsule, Take 145 mcg by mouth daily., Disp: , Rfl:    liraglutide (VICTOZA) 18 MG/3ML SOPN, Inject 1.8 mg into the skin daily., Disp: , Rfl:    lisinopril (ZESTRIL) 40 MG tablet, Take 1 tablet (40 mg total) by mouth daily., Disp: 30 tablet, Rfl: 6   omeprazole (PRILOSEC) 40 MG capsule, Take 40 mg by mouth daily., Disp: , Rfl:    PAXLOVID, 300/100, 20 x 150 MG & 10 x 100MG TBPK, See admin instructions. follow package directions, Disp: , Rfl:    polyethylene glycol-electrolytes (NULYTELY) 420 g solution, Take by mouth as directed., Disp: , Rfl:    promethazine-dextromethorphan (PROMETHAZINE-DM) 6.25-15 MG/5ML syrup, Take 5 mLs by mouth every 4 (four) hours as needed., Disp: , Rfl:    rosuvastatin (CRESTOR) 40 MG tablet, TAKE 0.5 TABLETS BY MOUTH DAILY, Disp: 90 tablet, Rfl: 3   spironolactone (ALDACTONE) 25 MG tablet, Take 1 tablet (25 mg total) by mouth daily. (Patient taking differently: Take 12.5 mg by mouth daily.), Disp: 30 tablet, Rfl: 6   torsemide (DEMADEX) 20 MG tablet, Take 1 tablet (20 mg total) by mouth daily., Disp: 30 tablet, Rfl: 6   Vitamin D, Ergocalciferol, (DRISDOL) 1.25 MG (50000 UNIT) CAPS capsule, Take 50,000 Units by mouth every Wednesday., Disp: , Rfl:     Cardiovascular and other pertinent studies:  EKG 07/15/2022: Sinus rhythm 74 bpm  Low voltage in precordial leads Possible old anteroseptal  infarct Diffuse nonspecific T-abnormality  Lower Extremity Arterial Duplex 06/05/2021:  Right External Iliac artery stenosis of <50%.  Absent flow noted of the bilateral mid superficial femoral and distal  superficial femoral arteries.  This exam reveals normal perfusion of the right lower extremity (ABI 1.00)  and mildly decreased perfusion of the left lower extremity, noted at the  post tibial artery level (ABI 0.83). There is mildly abnormal biphasic  waveform pattern at level of right ankle and severely abnormal monophasic  waveform pattern at the level of left ankle.  Compared to 07/10/2013, bilateral SFA occlusion is new.   Coronary angiography 05/07/2021:   Ost Cx to Prox Cx lesion is 40% stenosed.   Mid LAD lesion is 30% stenosed.   Prox RCA to Mid RCA lesion is 40% stenosed.   1.  Mild to moderate nonobstructive coronary artery disease. 2.  Left ventricular angiography was not performed.  EF was normal by echo. 3.  Moderately elevated left ventricular end-diastolic pressure at 22 mmHg.   Recommendations: Recommend aggressive medical therapy for nonobstructive coronary artery disease. Some of the patient's symptoms are likely related to diastolic heart failure due to uncontrolled hypertension and untreated sleep apnea. I increased the dose of Toprol and lisinopril.  I switched hydrochlorothiazide to small dose furosemide.  Echocardiogram 05/07/2021:  1. Left ventricular ejection fraction, by estimation, is 55 to 60%. The  left ventricle has normal function. The left ventricle has no regional  wall motion abnormalities. There is mild concentric left ventricular  hypertrophy. Left ventricular diastolic parameters are indeterminate.   2. Right ventricular systolic function  is normal. The right ventricular  size is normal. There is normal pulmonary artery systolic pressure.   3. The mitral valve is normal in structure. No evidence of mitral valve  regurgitation. No evidence of  mitral stenosis.   4. The aortic valve is tricuspid. Aortic valve regurgitation is not  visualized. No aortic stenosis is present.   5. The inferior vena cava is normal in size with greater than 50%  respiratory variability, suggesting right atrial pressure of 3 mmHg.    Recent labs: 02/14/2022: Glucose 163, BUN/Cr 16/0.73. EGFR >60. Na/K 142/3.5. Rest of the CMP normal H/H 11.8/36.7. MCV 855. Platelets 309  05/20/2021: Glucose 89, BUN/Cr 44/1.82. EGFR 30. Na/K 133/4.6.  H/H 10.5/33.8. MCV 89. Platelets 298 HbA1C 8.6% Chol 173, TG 68, HDL 54, LDL 105 TSH 5.1 elevated. Normal Free T4 BNP 234 (05/08/2021)    Review of Systems  Cardiovascular:  Negative for chest pain, claudication, dyspnea on exertion, leg swelling, palpitations and syncope.         Vitals:   10/14/22 1205 10/14/22 1206  BP: (!) 190/79 (!) 183/65  Pulse: 86 84  Resp: 16   SpO2: 97%       Body mass index is 44.59 kg/m. Filed Weights   10/14/22 1205  Weight: 236 lb (107 kg)     Objective:   Physical Exam Vitals and nursing note reviewed.  Constitutional:      General: She is not in acute distress. Neck:     Vascular: No JVD.  Cardiovascular:     Rate and Rhythm: Normal rate and regular rhythm.     Pulses:          Dorsalis pedis pulses are 0 on the right side and 0 on the left side.       Posterior tibial pulses are 0 on the right side and 0 on the left side.     Heart sounds: Normal heart sounds. No murmur heard.    Comments: Femoral pulses are difficult to appreciate due to body habitus Pulmonary:     Effort: Pulmonary effort is normal.     Breath sounds: Normal breath sounds. No wheezing or rales.  Musculoskeletal:     Right lower leg: No edema.     Left lower leg: No edema.          Assessment & Recommendations:    72 year old African-American female with hypertension, type 2 diabetes mellitus, HFpEF, PAD  HFpEF: Likely related to uncontrolled hypertension, diabetes,  obesity. Counseled regarding low-salt diet.  No added salt.  Limit daily salt intake to 2 g or less. Continue current management for hypertension and diabetes.  PAD: LEA duplex in 05/2021 suggested bilateral SFA occlusion. Currently. No claudication symptoms, no critical limb ischemia.  Pre-ulcerative calloses. Conitnue f/u w/podiatry. Continue aspirin, Crestor 40 mg daily. Check lipid panel. Not a candidate for Pletal given her history of heart failure. Encouraged regular walking.  Hypertension: Component of white coat hypertension. No changed made today. Continue close monitoring.   F/u in 3 months  Thank you for referring the patient to [place]. Please feel free to contact with any questions.   Nigel Mormon, MD Pager: [phone] Office: [phone]

## 2022-11-10 ENCOUNTER — Other Ambulatory Visit: Payer: Self-pay | Admitting: Internal Medicine

## 2022-11-10 ENCOUNTER — Ambulatory Visit
Admission: RE | Admit: 2022-11-10 | Discharge: 2022-11-10 | Disposition: A | Discharge status: Home / Self Care | Payer: Medicare HMO | Source: Ambulatory Visit | Attending: Internal Medicine | Admitting: Internal Medicine

## 2022-11-10 DIAGNOSIS — N6452 Nipple discharge: Secondary | ICD-10-CM

## 2022-11-10 DIAGNOSIS — R921 Mammographic calcification found on diagnostic imaging of breast: Secondary | ICD-10-CM

## 2022-12-01 ENCOUNTER — Encounter: Payer: Self-pay | Admitting: Podiatry

## 2022-12-01 ENCOUNTER — Ambulatory Visit (INDEPENDENT_AMBULATORY_CARE_PROVIDER_SITE_OTHER): Payer: Medicare HMO | Admitting: Podiatry

## 2022-12-01 DIAGNOSIS — M2142 Flat foot [pes planus] (acquired), left foot: Secondary | ICD-10-CM | POA: Diagnosis not present

## 2022-12-01 DIAGNOSIS — E119 Type 2 diabetes mellitus without complications: Secondary | ICD-10-CM

## 2022-12-01 DIAGNOSIS — Z1211 Encounter for screening for malignant neoplasm of colon: Secondary | ICD-10-CM | POA: Insufficient documentation

## 2022-12-01 DIAGNOSIS — M79676 Pain in unspecified toe(s): Secondary | ICD-10-CM

## 2022-12-01 DIAGNOSIS — M2141 Flat foot [pes planus] (acquired), right foot: Secondary | ICD-10-CM

## 2022-12-01 DIAGNOSIS — B351 Tinea unguium: Secondary | ICD-10-CM | POA: Diagnosis not present

## 2022-12-01 DIAGNOSIS — E1151 Type 2 diabetes mellitus with diabetic peripheral angiopathy without gangrene: Secondary | ICD-10-CM

## 2022-12-01 NOTE — Progress Notes (Signed)
ANNUAL DIABETIC FOOT EXAM  Subjective: Charlotte Stuart presents today for annual diabetic foot examination. She has been diagnosed with PAD and is followed by [facility] Dr. Truett Mainland. Last visit was 10/14/2022.  She states she is recovering from pneumonia. Chief Complaint  Patient presents with   Nail Problem    DFC/Diabetic shoes  BS-did not check today A1C-9.5 PCP-Velaquez PCP VST-11/26/2022   Patient confirms h/o diabetes.  Patient denies any numbness, tingling, burning, or pins/needle sensation in feet.  Patient has been diagnosed with neuropathy.  Risk factors: diabetes, PAD, h/o MI, HTN, CHF, CKD.  Cheron Schaumann., MD is patient's PCP.  Past Medical History:  Diagnosis Date   CAD (coronary artery disease)    Diabetes mellitus without complication    GERD (gastroesophageal reflux disease)    Heart attack 1992   Heart murmur    Hypercalcemia    Hyperlipidemia    Hypertension    Retinal detachment    right   Sensorineural hearing loss (SNHL) of right ear with unrestricted hearing of left ear    Vitamin D deficiency    Patient Active Problem List   Diagnosis Date Noted   Screening for malignant neoplasm of colon 12/01/2022   History of colon polyps 06/03/2022   Stage 3a chronic kidney disease 10/29/2021   Abnormal serum creatinine level 10/29/2021   Family history of colon cancer 10/18/2021   Snoring 06/04/2021   PAD (peripheral artery disease) 05/28/2021   Respiratory failure 05/10/2021   Heart failure with preserved ejection fraction 05/10/2021   Flash pulmonary edema 05/09/2021   Unstable angina 05/07/2021   Chest pain 08/05/2020   Colon cancer screening 08/05/2020   Costochondritis 08/05/2020   Flatulence, eructation and gas pain 08/05/2020   Family history of malignant neoplasm of gastrointestinal tract 08/05/2020   Hiatal hernia 08/05/2020   Left lower quadrant pain 08/05/2020   Rectal bleeding 08/05/2020    Acute bilateral low back pain without sciatica 04/23/2020   Daytime somnolence 08/10/2017   Heart palpitations 04/19/2017   Retinal detachment, right 04/09/2017   DOE (dyspnea on exertion) 04/08/2017   Ear pain, bilateral 12/25/2016   Sensorineural hearing loss (SNHL) of right ear with unrestricted hearing of left ear 12/25/2016   Pulsatile tinnitus of right ear 12/22/2016   Heart murmur 10/22/2015   Neck pain 09/22/2015   Hypercalcemia 09/22/2015   Hyperlipidemia 09/22/2015   Insulin long-term use 09/22/2015   Vitamin D deficiency 09/22/2015   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 06/26/2015   Allergic rhinitis 12/27/2013   Constipation 12/27/2013   Edema 12/27/2013   GERD (gastroesophageal reflux disease) 12/27/2013   Hyperlipemia 12/27/2013   Type 2 diabetes mellitus without complication, with long-term current use of insulin 12/27/2013   DIABETES MELLITUS, TYPE II 07/14/2010   CORONARY ATHEROSCLEROSIS NATIVE CORONARY ARTERY 05/14/2010   REDNESS OR DISCHARGE OF EYE 08/02/2007   CONTACT DERMATITIS 08/02/2007   OBESITY 06/14/2007   Essential hypertension 06/14/2007   GASTROESOPHAGEAL REFLUX DISEASE, HX OF 06/14/2007   Past Surgical History:  Procedure Laterality Date   BREAST BIOPSY Right 08/05/2010   LEFT HEART CATH AND CORONARY ANGIOGRAPHY N/A 05/07/2021   Procedure: LEFT HEART CATH AND CORONARY ANGIOGRAPHY;  Surgeon: Iran Ouch, MD;  Location: [facility];  Service: Cardiovascular;  Laterality: N/A;   Current Outpatient Medications on File Prior to Visit  Medication Sig Dispense Refill   acetaminophen (TYLENOL) 500 MG tablet Take 1,000 mg by mouth every 6 (six) hours  as needed for moderate pain.     aspirin EC 81 MG tablet Take 1 tablet by mouth daily.     atorvastatin (LIPITOR) 20 MG tablet Take 1 tablet every day by oral route.     BD PEN NEEDLE NANO U/F 32G X 4 MM MISC U UTD WITH VICTOZA  5   Carboxymethylcellulose Sodium (ARTIFICIAL  TEARS OP) Place 1 drop into both eyes in the morning, at noon, in the evening, and at bedtime.     carvedilol (COREG) 12.5 MG tablet Take 1 tablet (12.5 mg total) by mouth 2 (two) times daily with a meal. 60 tablet 6   clotrimazole-betamethasone (LOTRISONE) cream APPLY TO BOTH FEET AND BETWEEN TOES TWICE DAILY FOR 4 WEEKS     Continuous Blood Gluc Receiver (FREESTYLE LIBRE READER) DEVI Scan as needed up to 3 times per day as directed. Dx E11.65     Continuous Blood Gluc Sensor (FREESTYLE LIBRE 14 DAY SENSOR) MISC FreeStyle Libre 14 Day Sensor kit     Continuous Blood Gluc Sensor (FREESTYLE LIBRE SENSOR SYSTEM) MISC Inject into the skin.     dorzolamide-timolol (COSOPT) 22.3-6.8 MG/ML ophthalmic solution Place 1 drop into the right eye 2 (two) times daily.     erythromycin ophthalmic ointment APPLY INTO LEFT EYE TWICE DAILY UNTIL NEXT APPOINTMENT     furosemide (LASIX) 20 MG tablet Take 0.5 tablet by mouth once daily     glucose blood test strip Accu-Chek SmartView Test Strips  TEST THREE TIMES DAILY AS DIRECTED.     insulin regular human CONCENTRATED (HUMULIN R U-500 KWIKPEN) 500 UNIT/ML KwikPen Inject 50-100 Units into the skin 2 (two) times daily with a meal. 100 units in AM and 50 units in PM     Insulin Syringe-Needle U-100 (INSULIN SYRINGE .5CC/31GX5/16") 31G X 5/16" 0.5 ML MISC      Insulin Syringe-Needle U-100 (INSULIN SYRINGE 1CC/31GX5/16") 31G X 5/16" 1 ML MISC      linaclotide (LINZESS) 145 MCG CAPS capsule Take 145 mcg by mouth daily.     liraglutide (VICTOZA) 18 MG/3ML SOPN Inject 1.8 mg into the skin daily.     lisinopril (ZESTRIL) 40 MG tablet Take 1 tablet (40 mg total) by mouth daily. 30 tablet 6   omeprazole (PRILOSEC) 40 MG capsule Take 40 mg by mouth daily.     PAXLOVID, 300/100, 20 x 150 MG & 10 x 100MG  TBPK See admin instructions. follow package directions     rosuvastatin (CRESTOR) 40 MG tablet TAKE 0.5 TABLETS BY MOUTH DAILY 90 tablet 3   spironolactone (ALDACTONE) 25 MG  tablet Take 1 tablet (25 mg total) by mouth daily. (Patient taking differently: Take 12.5 mg by mouth daily.) 30 tablet 6   torsemide (DEMADEX) 20 MG tablet Take 1 tablet (20 mg total) by mouth daily. 30 tablet 6   Vitamin D, Ergocalciferol, (DRISDOL) 1.25 MG (50000 UNIT) CAPS capsule Take 50,000 Units by mouth every Wednesday.     No current facility-administered medications on file prior to visit.    Allergies  Allergen Reactions   Iodinated Contrast Media Itching and Swelling    Burning, swelling burns   Social History   Occupational History   Occupation: disability  Tobacco Use   Smoking status: Never   Smokeless tobacco: Former    Types: Snuff    Quit date: 12/2020   Tobacco comments:    Snuff x 30 years  Vaping Use   Vaping Use: Never used  Substance and Sexual Activity  Alcohol use: Not Currently   Drug use: Never   Sexual activity: Not on file   Family History  Problem Relation Age of Onset   Heart attack Father    Hypertension Sister    Diabetes Sister    Hypertension Brother    Diabetes Brother    Hypertension Brother    Diabetes Brother    Hypertension Brother    Diabetes Brother    Immunization History  Administered Date(s) Administered   Fluad Quad(high Dose 65+) 05/08/2021   Influenza Whole 07/14/2010   Pfizer Covid-19 Vaccine Bivalent Booster 959yrs & up 05/08/2021     Review of Systems: Negative except as noted in the HPI.   Objective: There were no vitals filed for this visit.  Charlotte Stuart is a pleasant 72 y.o. female in NAD. AAO X 3.  Vascular Examination: CFT <3 seconds b/l LE. Palpable pedal pulses b/l LE. Pedal hair absent. No pain with calf compression b/l. Lower extremity skin temperature gradient within normal limits. Nonpitting edema noted bilateral ankles. No cyanosis or clubbing noted b/l LE.  Dermatological Examination: Pedal skin is warm and supple b/l LE. No open wounds b/l LE. Toenails 1-5 b/l elongated, discolored,  dystrophic, thickened, crumbly with subungual debris and tenderness to dorsal palpation. No hyperkeratotic nor porokeratotic lesions present on today's visit.  Neurological Examination: Protective sensation intact 5/5 intact bilaterally with 10g monofilament b/l. Vibratory sensation intact b/l.  Musculoskeletal Examination: Normal muscle strength 5/5 to all lower extremity muscle groups bilaterally. Pes planus deformity noted bilateral LE.Marland Kitchen. No pain, crepitus or joint limitation noted with ROM b/l LE.  Patient ambulates independently without assistive aids.  Footwear Assessment: Does the patient wear appropriate shoes? Yes. Does the patient need inserts/orthotics? No.  Lab Results  Component Value Date   HGBA1C 8.6 (H) 05/07/2021   ADA Risk Categorization: Low Risk :  Patient has all of the following: Intact protective sensation No prior foot ulcer  No severe deformity Pedal pulses present  Assessment: 1. Pain due to onychomycosis of toenail   2. Pes planus of both feet   3. Type II diabetes mellitus with peripheral circulatory disorder   4. Encounter for diabetic foot exam     Plan: -Patient was evaluated and treated. All patient's and/or POA's questions/concerns answered on today's visit. -Patient dx with PAD followed by [facility] and last visit was Feb, 2024. -Diabetic foot examination performed today. -Discussed and educated patient on diabetic foot care, especially with  regards to the vascular, neurological and musculoskeletal systems. -Toenails 1-5 b/l were debrided in length and girth with sterile nail nippers and dremel without iatrogenic bleeding.  -Patient/POA to call should there be question/concern in the interim. Return in about 3 months (around 03/02/2023).  Freddie BreechJennifer L Imaan Padgett, DPM

## 2023-01-13 ENCOUNTER — Ambulatory Visit: Payer: Medicare HMO | Admitting: Cardiology

## 2023-02-17 ENCOUNTER — Encounter: Payer: Self-pay | Admitting: Cardiology

## 2023-02-17 ENCOUNTER — Ambulatory Visit: Payer: Medicare HMO | Admitting: Cardiology

## 2023-02-17 VITALS — BP 141/75 | HR 82 | Ht 61.0 in | Wt 237.0 lb

## 2023-02-17 DIAGNOSIS — I739 Peripheral vascular disease, unspecified: Secondary | ICD-10-CM

## 2023-02-17 DIAGNOSIS — I10 Essential (primary) hypertension: Secondary | ICD-10-CM

## 2023-02-17 DIAGNOSIS — I5032 Chronic diastolic (congestive) heart failure: Secondary | ICD-10-CM

## 2023-02-17 MED ORDER — ATORVASTATIN CALCIUM 40 MG PO TABS: ORAL_TABLET | ORAL | 3 refills | Status: DC

## 2023-02-17 NOTE — Progress Notes (Signed)
Patient referred by Cheron Schaumann.,* for HFpEF  Subjective:   Charlotte Stuart, female    DOB: 1950-12-21, 72 y.o.   MRN: [id number]   Chief Complaint  Patient presents with   peripheral artery disease   Follow-up     HPI  72 year old African-American female with hypertension, type 2 diabetes mellitus, HFpEF, PAD  Patient is here today with her daughter.  She is doing well, denies any chest pain, shortness of breath symptoms.  Blood pressure slightly elevated in the office, but very well-controlled at home.  Her diabetes remains uncontrolled.  Fortunately, she has an upcoming appointment with her endocrinologist.  Initial consultation visit 05/2021: Patient is here today to establish care with me. She was recently hospitalized with dyspnea, chest pai, fatigue. Cath showed nonobstructive CAD. Echocardiogram showed normal EF, did not show any major diastolic dysfunction.  However, BNP was elevated.  She was treated for HFpEF.   Patient is here with her daughter today.  She still uses half a tablespoon added salt on her plate, in addition to salt used in cooking.  She does report exertional dyspnea and occasional chest pain.  In addition, she reports generalized fatigue and claudication.  She sees a podiatrist regularly.  She does not see a nephrologist.  I personally reviewed her coronary angiogram and echocardiogram performed during recent hospitalization.   Current Outpatient Medications:    acetaminophen (TYLENOL) 500 MG tablet, Take 1,000 mg by mouth every 6 (six) hours as needed for moderate pain., Disp: , Rfl:    aspirin EC 81 MG tablet, Take 1 tablet by mouth daily., Disp: , Rfl:    atorvastatin (LIPITOR) 20 MG tablet, Take 1 tablet every day by oral route., Disp: , Rfl:    BD PEN NEEDLE NANO U/F 32G X 4 MM MISC, U UTD WITH VICTOZA, Disp: , Rfl: 5   Carboxymethylcellulose Sodium (ARTIFICIAL TEARS OP), Place 1 drop into both eyes in the morning, at noon, in the  evening, and at bedtime., Disp: , Rfl:    carvedilol (COREG) 12.5 MG tablet, Take 1 tablet (12.5 mg total) by mouth 2 (two) times daily with a meal., Disp: 60 tablet, Rfl: 6   Continuous Blood Gluc Receiver (FREESTYLE LIBRE READER) DEVI, Scan as needed up to 3 times per day as directed. Dx E11.65, Disp: , Rfl:    Continuous Blood Gluc Sensor (FREESTYLE LIBRE 14 DAY SENSOR) MISC, FreeStyle Libre 14 Day Sensor kit, Disp: , Rfl:    Continuous Blood Gluc Sensor (FREESTYLE LIBRE SENSOR SYSTEM) MISC, Inject into the skin., Disp: , Rfl:    dorzolamide-timolol (COSOPT) 22.3-6.8 MG/ML ophthalmic solution, Place 1 drop into the right eye 2 (two) times daily., Disp: , Rfl:    furosemide (LASIX) 20 MG tablet, Take 0.5 tablet by mouth once daily, Disp: , Rfl:    glucose blood test strip, Accu-Chek SmartView Test Strips  TEST THREE TIMES DAILY AS DIRECTED., Disp: , Rfl:    insulin regular human CONCENTRATED (HUMULIN R U-500 KWIKPEN) 500 UNIT/ML KwikPen, Inject 50-100 Units into the skin 2 (two) times daily with a meal. 100 units in AM and 50 units in PM, Disp: , Rfl:    Insulin Syringe-Needle U-100 (INSULIN SYRINGE .5CC/31GX5/16") 31G X 5/16" 0.5 ML MISC, , Disp: , Rfl:    Insulin Syringe-Needle U-100 (INSULIN SYRINGE 1CC/31GX5/16") 31G X 5/16" 1 ML MISC, , Disp: , Rfl:    linaclotide (LINZESS) 145 MCG CAPS capsule, Take 145 mcg by mouth daily., Disp: , Rfl:  liraglutide (VICTOZA) 18 MG/3ML SOPN, Inject 1.8 mg into the skin daily., Disp: , Rfl:    lisinopril (ZESTRIL) 40 MG tablet, Take 1 tablet (40 mg total) by mouth daily., Disp: 30 tablet, Rfl: 6   omeprazole (PRILOSEC) 40 MG capsule, Take 40 mg by mouth daily., Disp: , Rfl:    rosuvastatin (CRESTOR) 40 MG tablet, TAKE 0.5 TABLETS BY MOUTH DAILY (Patient taking differently: Take 40 mg by mouth daily.), Disp: 90 tablet, Rfl: 3   spironolactone (ALDACTONE) 25 MG tablet, Take 1 tablet (25 mg total) by mouth daily. (Patient taking differently: Take 12.5 mg by  mouth daily.), Disp: 30 tablet, Rfl: 6   torsemide (DEMADEX) 20 MG tablet, Take 1 tablet (20 mg total) by mouth daily., Disp: 30 tablet, Rfl: 6   Vitamin D, Ergocalciferol, (DRISDOL) 1.25 MG (50000 UNIT) CAPS capsule, Take 50,000 Units by mouth every Wednesday., Disp: , Rfl:     Cardiovascular and other pertinent studies:  RKG 02/17/2023: Sinus rhythm 72 bpm Normal EKG  Lower Extremity Arterial Duplex 06/05/2021:  Right External Iliac artery stenosis of <50%.  Absent flow noted of the bilateral mid superficial femoral and distal  superficial femoral arteries.  This exam reveals normal perfusion of the right lower extremity (ABI 1.00)  and mildly decreased perfusion of the left lower extremity, noted at the  post tibial artery level (ABI 0.83). There is mildly abnormal biphasic  waveform pattern at level of right ankle and severely abnormal monophasic  waveform pattern at the level of left ankle.  Compared to 07/10/2013, bilateral SFA occlusion is new.   Coronary angiography 05/07/2021:   Ost Cx to Prox Cx lesion is 40% stenosed.   Mid LAD lesion is 30% stenosed.   Prox RCA to Mid RCA lesion is 40% stenosed.   1.  Mild to moderate nonobstructive coronary artery disease. 2.  Left ventricular angiography was not performed.  EF was normal by echo. 3.  Moderately elevated left ventricular end-diastolic pressure at 22 mmHg.   Recommendations: Recommend aggressive medical therapy for nonobstructive coronary artery disease. Some of the patient's symptoms are likely related to diastolic heart failure due to uncontrolled hypertension and untreated sleep apnea. I increased the dose of Toprol and lisinopril.  I switched hydrochlorothiazide to small dose furosemide.  Echocardiogram 05/07/2021:  1. Left ventricular ejection fraction, by estimation, is 55 to 60%. The  left ventricle has normal function. The left ventricle has no regional  wall motion abnormalities. There is mild concentric left  ventricular  hypertrophy. Left ventricular diastolic parameters are indeterminate.   2. Right ventricular systolic function is normal. The right ventricular  size is normal. There is normal pulmonary artery systolic pressure.   3. The mitral valve is normal in structure. No evidence of mitral valve  regurgitation. No evidence of mitral stenosis.   4. The aortic valve is tricuspid. Aortic valve regurgitation is not  visualized. No aortic stenosis is present.   5. The inferior vena cava is normal in size with greater than 50%  respiratory variability, suggesting right atrial pressure of 3 mmHg.    Recent labs: 12/17/2022: Glucose 258, BUN/Cr 14/0.81. EGFR 78. Na/K 145/3.4. AlKP 112. Rest of the CMP normal H/H 12.9/40. MCV 83. Platelets 284 HbA1C 10.6 % Chol 168, TG 148, HDL 47, LDL 96 TSH 2.9normal  02/14/2022: Glucose 163, BUN/Cr 16/0.73. EGFR >60. Na/K 142/3.5. Rest of the CMP normal H/H 11.8/36.7. MCV 855. Platelets 309  05/20/2021: Glucose 89, BUN/Cr 44/1.82. EGFR 30. Na/K 133/4.6.  H/H  10.5/33.8. MCV 89. Platelets 298 HbA1C 8.6% Chol 173, TG 68, HDL 54, LDL 105 TSH 5.1 elevated. Normal Free T4 BNP 234 (05/08/2021)    Review of Systems  Cardiovascular:  Negative for chest pain, claudication, dyspnea on exertion, leg swelling, palpitations and syncope.         Vitals:   02/17/23 1118  BP: (!) 141/75  Pulse: 82      Body mass index is 44.78 kg/m. Filed Weights   02/17/23 1118  Weight: 237 lb (107.5 kg)     Objective:   Physical Exam Vitals and nursing note reviewed.  Constitutional:      General: She is not in acute distress. Neck:     Vascular: No JVD.  Cardiovascular:     Rate and Rhythm: Normal rate and regular rhythm.     Pulses:          Dorsalis pedis pulses are 0 on the right side and 0 on the left side.       Posterior tibial pulses are 0 on the right side and 0 on the left side.     Heart sounds: Normal heart sounds. No murmur heard.     Comments: Femoral pulses are difficult to appreciate due to body habitus Pulmonary:     Effort: Pulmonary effort is normal.     Breath sounds: Normal breath sounds. No wheezing or rales.  Musculoskeletal:     Right lower leg: No edema.     Left lower leg: No edema.          Assessment & Recommendations:    72 year old African-American female with hypertension, type 2 diabetes mellitus, HFpEF, PAD  HFpEF: Likely related to uncontrolled hypertension, diabetes, obesity. Counseled regarding low-salt diet.  No added salt.  Limit daily salt intake to 2 g or less. Continue current management for hypertension and diabetes.  PAD: LEA duplex in 05/2021 suggested bilateral SFA occlusion. Currently. No claudication symptoms, no critical limb ischemia.  Continue aspirin 81 mg daily. Increase Atorvastatin to 40 mg daily. Repeat lipid panel in 3 months. Not a candidate for Pletal given her history of heart failure. Encouraged regular walking.  Hypertension: Component of white coat hypertension. No changed made today. Continue close monitoring.   F/u in 3 months  Thank you for referring the patient to [place]. Please feel free to contact with any questions.   Elder Negus, MD Pager: [phone] Office: [phone]

## 2023-04-14 ENCOUNTER — Ambulatory Visit: Payer: Medicare HMO | Admitting: Podiatry

## 2023-05-10 ENCOUNTER — Encounter: Payer: Self-pay | Admitting: Podiatry

## 2023-05-10 ENCOUNTER — Ambulatory Visit (INDEPENDENT_AMBULATORY_CARE_PROVIDER_SITE_OTHER): Payer: Medicare HMO | Admitting: Podiatry

## 2023-05-10 DIAGNOSIS — M79676 Pain in unspecified toe(s): Secondary | ICD-10-CM

## 2023-05-10 DIAGNOSIS — B351 Tinea unguium: Secondary | ICD-10-CM

## 2023-05-10 DIAGNOSIS — E1151 Type 2 diabetes mellitus with diabetic peripheral angiopathy without gangrene: Secondary | ICD-10-CM

## 2023-05-10 NOTE — Progress Notes (Signed)
This patient returns to my office for at risk foot care.  This patient requires this care by a professional since this patient will be at risk due to having DM, CKD and PAD.  This patient is unable to cut nails herself since the patient cannot reach her nails.These nails are painful walking and wearing shoes.  This patient presents for at risk foot care today.  General Appearance  Alert, conversant and in no acute stress.  Vascular  Dorsalis pedis and posterior tibial  pulses are palpable  bilaterally.  Capillary return is within normal limits  bilaterally. Temperature is within normal limits  bilaterally.  Neurologic  Senn-Weinstein monofilament wire test within normal limits  bilaterally. Muscle power within normal limits bilaterally.  Nails Thick disfigured discolored nails with subungual debris  from hallux to fifth toes bilaterally. No evidence of bacterial infection or drainage bilaterally.  Orthopedic  No limitations of motion  feet .  No crepitus or effusions noted.  No bony pathology or digital deformities noted.  Skin  normotropic skin with no porokeratosis noted bilaterally.  No signs of infections or ulcers noted.     Onychomycosis  Pain in right toes  Pain in left toes  Consent was obtained for treatment procedures.   Mechanical debridement of nails 1-5  bilaterally performed with a nail nipper.  Filed with dremel without incident.    Return office visit   3 months                   Told patient to return for periodic foot care and evaluation due to potential at risk complications.   Helane Gunther DPM

## 2023-05-17 ENCOUNTER — Institutional Professional Consult (permissible substitution) (INDEPENDENT_AMBULATORY_CARE_PROVIDER_SITE_OTHER): Payer: Medicare HMO | Admitting: Otolaryngology

## 2023-05-25 ENCOUNTER — Other Ambulatory Visit: Payer: Medicare HMO

## 2023-08-12 ENCOUNTER — Ambulatory Visit: Payer: Self-pay | Admitting: Cardiology

## 2023-09-06 ENCOUNTER — Encounter: Payer: Self-pay | Admitting: Cardiology

## 2023-09-06 ENCOUNTER — Ambulatory Visit: Payer: Medicare HMO | Attending: Cardiology | Admitting: Cardiology

## 2023-09-06 VITALS — BP 138/64 | HR 79 | Ht 61.0 in | Wt 237.0 lb

## 2023-09-06 DIAGNOSIS — I10 Essential (primary) hypertension: Secondary | ICD-10-CM

## 2023-09-06 DIAGNOSIS — E782 Mixed hyperlipidemia: Secondary | ICD-10-CM | POA: Diagnosis not present

## 2023-09-06 DIAGNOSIS — I5032 Chronic diastolic (congestive) heart failure: Secondary | ICD-10-CM | POA: Diagnosis not present

## 2023-09-06 NOTE — Progress Notes (Signed)
  Cardiology Office Note:  .   Date:  09/06/2023  ID:  Charlotte Stuart, DOB 09/02/1950, MRN [id number] PCP: Andrew Truman GRADE., MD  [facility] Providers Cardiologist:  Newman Lawrence, MD PCP: Andrew Truman GRADE., MD  Chief Complaint  Patient presents with   HFpEF      History of Present Illness: Charlotte    Charlotte Stuart is a 73 y.o. female with hypertension, type 2 diabetes mellitus, HFpEF, PAD   From cardiac standpoint, patient is doing well.  She denies any exertional dyspnea, orthopnea, PND, leg edema symptoms.  She recently had paresthesias in left upper neck collar.  CT angiogram head/neck performed at [facility] was normal.  She is scheduled to see neurologist.  Diabetes is uncontrolled.  She is working with her endocrinologist Dr. Braulio with [facility]   Vitals:   09/06/23 1114  BP: 138/64  Pulse: 79  SpO2: 99%     ROS:  Review of Systems  Cardiovascular:  Negative for chest pain, dyspnea on exertion, leg swelling, palpitations and syncope.     Studies Reviewed: Charlotte         Independently interpreted 05/2023: HbA1C 9.2%  05/2022: Chol 194, TG 228, HDL 53, LDL 125 Hb 12.3 Cr 0.7    Physical Exam:   Physical Exam Vitals and nursing note reviewed.  Constitutional:      General: She is not in acute distress. Neck:     Vascular: No JVD.  Cardiovascular:     Rate and Rhythm: Normal rate and regular rhythm.     Heart sounds: Normal heart sounds. No murmur heard. Pulmonary:     Effort: Pulmonary effort is normal.     Breath sounds: Normal breath sounds. No wheezing or rales.  Musculoskeletal:     Right lower leg: No edema.     Left lower leg: No edema.      VISIT DIAGNOSES:   ICD-10-CM   1. Essential hypertension  I10     2. Mixed hyperlipidemia  E78.2 Lipid Profile    Lipid Profile    3. Chronic heart failure with preserved ejection fraction (HCC)  I50.32        ASSESSMENT AND PLAN: .    Charlotte Stuart is a  73 y.o. female with hypertension, type 2 diabetes mellitus, HFpEF, PAD   HFpEF: Likely related to uncontrolled hypertension, diabetes, obesity. Counseled regarding low-salt diet.  No added salt.  Limit daily salt intake to 2 g or less. Doing well with current management for hypertension and diabetes.   PAD: LEA duplex in 05/2021 suggested bilateral SFA occlusion. Currently. No claudication symptoms, no critical limb ischemia.  Continue aspirin  81 mg daily, atorvastatin  to 40 mg daily. Check lipid panel today. Needs aggressive diabetes management, defer to Dr. Braulio.    Hypertension: Fairly well-controlled.         No orders of the defined types were placed in this encounter.    F/u in 1 year  Signed, Newman JINNY Lawrence, MD

## 2023-09-06 NOTE — Patient Instructions (Signed)
 Medication Instructions:   Your physician recommends that you continue on your current medications as directed. Please refer to the Current Medication list given to you today.  *If you need a refill on your cardiac medications before your next appointment, please call your pharmacy*   Lab Work:  TODAY--DOWNSTAIRS FIRST FLOOR OF THIS BUILDING AT [facility]--LIPIDS  If you have labs (blood work) drawn today and your tests are completely normal, you will receive your results only by: MyChart Message (if you have MyChart) OR A paper copy in the mail If you have any lab test that is abnormal or we need to change your treatment, we will call you to review the results.    Follow-Up: At [facility], you and your health needs are our priority.  As part of our continuing mission to provide you with exceptional heart care, we have created designated Provider Care Teams.  These Care Teams include your primary Cardiologist (physician) and Advanced Practice Providers (APPs -  Physician Assistants and Nurse Practitioners) who all work together to provide you with the care you need, when you need it.  We recommend signing up for the patient portal called MyChart.  Sign up information is provided on this After Visit Summary.  MyChart is used to connect with patients for Virtual Visits (Telemedicine).  Patients are able to view lab/test results, encounter notes, upcoming appointments, etc.  Non-urgent messages can be sent to your provider as well.   To learn more about what you can do with MyChart, go to [email or web address].    Your next appointment:   1 year(s)  Provider:   DR. PATWARDHAN

## 2023-09-07 ENCOUNTER — Telehealth: Payer: Self-pay | Admitting: *Deleted

## 2023-09-07 DIAGNOSIS — Z79899 Other long term (current) drug therapy: Secondary | ICD-10-CM

## 2023-09-07 DIAGNOSIS — E782 Mixed hyperlipidemia: Secondary | ICD-10-CM

## 2023-09-07 DIAGNOSIS — I2510 Atherosclerotic heart disease of native coronary artery without angina pectoris: Secondary | ICD-10-CM

## 2023-09-07 LAB — LIPID PANEL
Chol/HDL Ratio: 3.3 {ratio} (ref 0.0–4.4)
Cholesterol, Total: 184 mg/dL (ref 100–199)
HDL: 55 mg/dL (ref >39)
LDL Chol Calc (NIH): 114 mg/dL — ABNORMAL HIGH (ref 0–99)
Triglycerides: 84 mg/dL (ref 0–149)
VLDL Cholesterol Cal: 15 mg/dL (ref 5–40)

## 2023-09-07 MED ORDER — ATORVASTATIN CALCIUM 80 MG PO TABS: 80.0000 mg | ORAL_TABLET | Freq: Every day | ORAL | 2 refills | Status: DC

## 2023-09-07 NOTE — Telephone Encounter (Signed)
 The patient has been notified of the result and verbalized understanding.  All questions (if any) were answered.  Pt aware to increase her lipitor  80 mg po daily and come in for repeat lipids in 3 months to reassess.  Confirmed the pharmacy of choice with the pt.   Pt aware to come in for repeat lipids in 3 months, which would be around mid April.  Order placed and released for then.  She is aware she can go to any [facility] to have this done.  She is aware to go fasting to this lab appt.   Pt verbalized understanding and agrees with this plan.

## 2023-09-07 NOTE — Progress Notes (Signed)
 LDL better than before, but not optimally controlled. Increase Lipitor to 80 mg daily. Repeat lipid panel in 3 months.  Thanks MJP

## 2023-09-07 NOTE — Telephone Encounter (Signed)
-----   Message from [facility] sent at 09/07/2023 12:18 PM EST ----- LDL better than before, but not optimally controlled. Increase Lipitor to 80 mg daily. Repeat lipid panel in 3 months.  Thanks MJP

## 2023-09-08 ENCOUNTER — Ambulatory Visit (INDEPENDENT_AMBULATORY_CARE_PROVIDER_SITE_OTHER): Payer: Medicare HMO | Admitting: Podiatry

## 2023-09-08 DIAGNOSIS — Z91198 Patient's noncompliance with other medical treatment and regimen for other reason: Secondary | ICD-10-CM

## 2023-09-08 NOTE — Progress Notes (Signed)
 1. Failure to attend appointment with reason given    Patient canceled appt

## 2023-10-12 ENCOUNTER — Other Ambulatory Visit: Payer: Self-pay | Admitting: Internal Medicine

## 2023-10-12 DIAGNOSIS — R921 Mammographic calcification found on diagnostic imaging of breast: Secondary | ICD-10-CM

## 2023-10-20 ENCOUNTER — Other Ambulatory Visit: Payer: Self-pay | Admitting: Internal Medicine

## 2023-10-20 DIAGNOSIS — N644 Mastodynia: Secondary | ICD-10-CM

## 2023-10-20 DIAGNOSIS — R921 Mammographic calcification found on diagnostic imaging of breast: Secondary | ICD-10-CM

## 2023-11-03 ENCOUNTER — Encounter: Payer: Self-pay | Admitting: Podiatry

## 2023-11-03 ENCOUNTER — Ambulatory Visit (INDEPENDENT_AMBULATORY_CARE_PROVIDER_SITE_OTHER): Admitting: Podiatry

## 2023-11-03 VITALS — Ht 61.0 in | Wt 237.0 lb

## 2023-11-03 DIAGNOSIS — E1151 Type 2 diabetes mellitus with diabetic peripheral angiopathy without gangrene: Secondary | ICD-10-CM | POA: Diagnosis not present

## 2023-11-03 DIAGNOSIS — B353 Tinea pedis: Secondary | ICD-10-CM

## 2023-11-03 DIAGNOSIS — B351 Tinea unguium: Secondary | ICD-10-CM

## 2023-11-03 DIAGNOSIS — M79676 Pain in unspecified toe(s): Secondary | ICD-10-CM

## 2023-11-03 MED ORDER — KETOCONAZOLE 2 % EX CREA: TOPICAL_CREAM | CUTANEOUS | 1 refills | Status: DC

## 2023-11-03 NOTE — Progress Notes (Signed)
 Subjective:  Patient ID: Charlotte Stuart, female    DOB: [birth year],  MRN: [id number]  73 y.o. female presents at risk foot care. Pt has h/o NIDDM with PAD and painful, elongated thickened toenails x 10 which are symptomatic when wearing enclosed shoe gear. This interferes with his/her daily activities.  Chief Complaint  Patient presents with   Nail Problem    Pt is here for [facility] last A1C was 9.5 PCP is Velazquez and LOV was in November.    New problem(s): None   PCP is Velazquez, Vira Browns., MD.  Allergies  Allergen Reactions   Iodinated Contrast Media Itching and Swelling    Burning, swelling burns    Review of Systems: Negative except as noted in the HPI.   Objective:  Charlotte Stuart is a pleasant 73 y.o. female obese in NAD. AAO x 3.  Vascular Examination: CFT <3 seconds b/l LE. Palpable pedal pulses b/l LE. Pedal hair absent. No pain with calf compression b/l. Lower extremity skin temperature gradient within normal limits. Nonpitting edema noted bilateral ankles. No cyanosis or clubbing noted b/l LE.  Dermatological Examination: Pedal skin is warm and supple b/l LE. No open wounds b/l LE. Toenails 1-5 b/l elongated, discolored, dystrophic, thickened, crumbly with subungual debris and tenderness to dorsal palpation. No hyperkeratotic nor porokeratotic lesions present on today's visit.  Diffuse scaling noted peripherally and plantarly b/l feet.  Mild foot odor. No interdigital macerations.  No blisters, no weeping. No signs of secondary bacterial infection noted.  Neurological Examination: Protective sensation intact 5/5 intact bilaterally with 10g monofilament b/l. Vibratory sensation intact b/l.  Musculoskeletal Examination: Normal muscle strength 5/5 to all lower extremity muscle groups bilaterally. Pes planus deformity noted bilateral LE.Marland Kitchen No pain, crepitus or joint limitation noted with ROM b/l LE.  Patient ambulates independently without assistive  aids.  Radiographs: None   Assessment:   1. Pain due to onychomycosis of toenail   2. Tinea pedis of both feet   3. Type II diabetes mellitus with peripheral circulatory disorder (HCC)     Plan:   Meds ordered this encounter  Medications   ketoconazole (NIZORAL) 2 % cream    Sig: Apply to both feet and between toes once daily for 6 weeks.    Dispense:  60 g    Refill:  1   Consent given for treatment. Patient examined. All patient's and/or POA's questions/concerns addressed on today's visit. Toenails 1-5 debrided in length and girth without incident. Continue foot and shoe inspections daily. Monitor blood glucose per PCP/Endocrinologist's recommendations. Continue soft, supportive shoe gear daily. Report any pedal injuries to medical professional. Call office if there are any questions/concerns. -Discussed tinea pedis infection. To prevent re-infection of tinea pedis, patient/POA/caregiver instructed to spray shoes with Lysol every evening. -For tinea pedis, Rx sent to pharmacy for Ketoconazole Cream 2% to be applied once daily for six weeks. -Patient/POA to call should there be question/concern in the interim.  Return in about 3 months (around 02/03/2024).  Freddie Breech, DPM      [place] LOCATION: [address], [place] [address]                   Office [phone])  [phone]   [facility] LOCATION: [address] [place], [place] [address] Office [phone]

## 2023-11-03 NOTE — Patient Instructions (Signed)
 To prevent reinfection, spray shoes with lysol every evening.  Clean tub or shower with bleach based cleanser.  Athlete's Foot Athlete's foot (tinea pedis) is a fungal infection of the skin on your feet. It often occurs on the skin that is between or underneath the toes. It can also occur on the soles of your feet. The infection can spread from person to person (is contagious). It can also spread when a person's bare feet come in contact with the fungus on shower floors or on items such as shoes. What are the causes? This condition is caused by a fungus that grows in warm, moist places. You can get athlete's foot by sharing shoes, shower stalls, towels, and wet floors with someone who is infected. Not washing your feet or changing your socks often enough can also lead to athlete's foot. What increases the risk? This condition is more likely to develop in: Men. People who have a weak body defense system (immune system). People who have diabetes. People who use public showers, such as at a gym. People who wear heavy-duty shoes, such as industrial or military shoes. Seasons with warm, humid weather. What are the signs or symptoms? Symptoms of this condition include: Itchy areas between your toes or on the soles of your feet. White, flaky, or scaly areas between your toes or on the soles of your feet. Very itchy small blisters between your toes or on the soles of your feet. Small cuts in your skin. These cuts can become infected. Thick or discolored toenails. How is this diagnosed? This condition may be diagnosed with a physical exam and a review of your medical history. Your health care provider may also take a skin or toenail sample to examine under a microscope. How is this treated? This condition is treated with antifungal medicines. These may be applied as powders, ointments, or creams. In severe cases, an oral antifungal medicine may be given. Follow these instructions at  home: Medicines Apply or take over-the-counter and prescription medicines only as told by your health care provider. Apply your antifungal medicine as told by your health care provider. Do not stop using the antifungal even if your condition improves. Foot care Do not scratch your feet. Keep your feet dry: Wear cotton or wool socks. Change your socks every day or if they become wet. Wear shoes that allow air to flow, such as sandals or canvas tennis shoes. Wash and dry your feet, including the area between your toes. Also, wash and dry your feet: Every day or as told by your health care provider. After exercising. General instructions Do not let others use towels, shoes, nail clippers, or other personal items that touch your feet. Protect your feet by wearing sandals in wet areas, such as locker rooms and shared showers. Keep all follow-up visits. This is important. If you have diabetes, keep your blood sugar under control. Contact a health care provider if: You have a fever. You have swelling, soreness, warmth, or redness in your foot. Your feet are not getting better with treatment. Your symptoms get worse. You have new symptoms. You have severe pain. Summary Athlete's foot (tinea pedis) is a fungal infection of the skin on your feet. It often occurs on skin that is between or underneath the toes. This condition is caused by a fungus that grows in warm, moist places. Symptoms include white, flaky, or scaly areas between your toes or on the soles of your feet. This condition is treated with antifungal medicines.   Keep your feet clean. Always dry them thoroughly. This information is not intended to replace advice given to you by your health care provider. Make sure you discuss any questions you have with your health care provider. Document Revised: 12/01/2020 Document Reviewed: 12/01/2020 Elsevier Patient Education  2024 Elsevier Inc.  

## 2023-11-08 ENCOUNTER — Encounter: Payer: Self-pay | Admitting: Podiatry

## 2023-12-01 ENCOUNTER — Encounter

## 2023-12-01 ENCOUNTER — Other Ambulatory Visit

## 2023-12-03 ENCOUNTER — Other Ambulatory Visit

## 2023-12-03 ENCOUNTER — Ambulatory Visit

## 2023-12-03 ENCOUNTER — Ambulatory Visit
Admission: RE | Admit: 2023-12-03 | Discharge: 2023-12-03 | Disposition: A | Discharge status: Home / Self Care | Source: Ambulatory Visit | Attending: Internal Medicine | Admitting: Internal Medicine

## 2023-12-03 DIAGNOSIS — R921 Mammographic calcification found on diagnostic imaging of breast: Secondary | ICD-10-CM

## 2024-01-05 ENCOUNTER — Ambulatory Visit: Payer: Medicare HMO | Admitting: Podiatry

## 2024-01-08 LAB — LIPID PANEL
Chol/HDL Ratio: 3.1 ratio (ref 0.0–4.4)
Cholesterol, Total: 182 mg/dL (ref 100–199)
HDL: 58 mg/dL (ref >39)
LDL Chol Calc (NIH): 102 mg/dL — ABNORMAL HIGH (ref 0–99)
Triglycerides: 125 mg/dL (ref 0–149)
VLDL Cholesterol Cal: 22 mg/dL (ref 5–40)

## 2024-01-09 ENCOUNTER — Ambulatory Visit: Payer: Self-pay | Admitting: Cardiology

## 2024-01-09 DIAGNOSIS — E782 Mixed hyperlipidemia: Secondary | ICD-10-CM

## 2024-01-09 NOTE — Progress Notes (Signed)
 LDL remains above goal of 70.  If patient has been compliant with Lipitor  80 mg daily, recommend adding Zetia 10 mg daily and repeat lipid panel in 3 months. If LDL remains >70 then, will need referral to lipid clinic.  Thanks MJP

## 2024-01-12 ENCOUNTER — Other Ambulatory Visit: Payer: Self-pay

## 2024-01-12 DIAGNOSIS — E782 Mixed hyperlipidemia: Secondary | ICD-10-CM

## 2024-01-12 MED ORDER — EZETIMIBE 10 MG PO TABS: 10.0000 mg | ORAL_TABLET | Freq: Every day | ORAL | 3 refills | Status: AC

## 2024-02-02 ENCOUNTER — Ambulatory Visit: Payer: Medicare HMO | Admitting: Podiatry

## 2024-02-23 ENCOUNTER — Encounter: Payer: Self-pay | Admitting: Podiatry

## 2024-02-23 ENCOUNTER — Ambulatory Visit (INDEPENDENT_AMBULATORY_CARE_PROVIDER_SITE_OTHER): Admitting: Podiatry

## 2024-02-23 DIAGNOSIS — B353 Tinea pedis: Secondary | ICD-10-CM

## 2024-02-23 DIAGNOSIS — E119 Type 2 diabetes mellitus without complications: Secondary | ICD-10-CM | POA: Diagnosis not present

## 2024-02-23 DIAGNOSIS — B351 Tinea unguium: Secondary | ICD-10-CM

## 2024-02-23 DIAGNOSIS — M2141 Flat foot [pes planus] (acquired), right foot: Secondary | ICD-10-CM

## 2024-02-23 DIAGNOSIS — M2142 Flat foot [pes planus] (acquired), left foot: Secondary | ICD-10-CM

## 2024-02-23 DIAGNOSIS — M79676 Pain in unspecified toe(s): Secondary | ICD-10-CM | POA: Diagnosis not present

## 2024-02-23 DIAGNOSIS — E1151 Type 2 diabetes mellitus with diabetic peripheral angiopathy without gangrene: Secondary | ICD-10-CM

## 2024-02-23 MED ORDER — KETOCONAZOLE 2 % EX CREA: TOPICAL_CREAM | CUTANEOUS | 1 refills | Status: AC

## 2024-02-26 NOTE — Progress Notes (Signed)
 ANNUAL DIABETIC FOOT EXAM  Subjective: Charlotte Stuart presents today for annual diabetic foot exam.  Chief Complaint  Patient presents with   [facility]    [facility] IDDM A1C 8.6. Toenail trim. LOV with PCP 01/2024.   Patient confirms h/o diabetes.  Patient denies any h/o foot wounds.  Andrew Truman GRADE., MD is patient's PCP.  Past Medical History:  Diagnosis Date   CAD (coronary artery disease)    Diabetes mellitus without complication (HCC)    GERD (gastroesophageal reflux disease)    Heart attack (HCC) 1992   Heart murmur    Hypercalcemia    Hyperlipidemia    Hypertension    Retinal detachment    right   Sensorineural hearing loss (SNHL) of right ear with unrestricted hearing of left ear    Vitamin D deficiency    Patient Active Problem List   Diagnosis Date Noted   Screening for malignant neoplasm of colon 12/01/2022   History of colon polyps 06/03/2022   Stage 3a chronic kidney disease (HCC) 10/29/2021   Abnormal serum creatinine level 10/29/2021   Family history of colon cancer 10/18/2021   Snoring 06/04/2021   PAD (peripheral artery disease) (HCC) 05/28/2021   Respiratory failure (HCC) 05/10/2021   Heart failure with preserved ejection fraction (HCC) 05/10/2021   Flash pulmonary edema (HCC) 05/09/2021   Unstable angina (HCC) 05/07/2021   Chest pain 08/05/2020   Colon cancer screening 08/05/2020   Costochondritis 08/05/2020   Flatulence, eructation and gas pain 08/05/2020   Family history of malignant neoplasm of gastrointestinal tract 08/05/2020   Hiatal hernia 08/05/2020   Left lower quadrant pain 08/05/2020   Rectal bleeding 08/05/2020   Acute bilateral low back pain without sciatica 04/23/2020   Daytime somnolence 08/10/2017   Heart palpitations 04/19/2017   Retinal detachment, right 04/09/2017   DOE (dyspnea on exertion) 04/08/2017   Ear pain, bilateral 12/25/2016   Sensorineural hearing loss (SNHL) of right ear with unrestricted hearing of left ear  12/25/2016   Pulsatile tinnitus of right ear 12/22/2016   Heart murmur 10/22/2015   Neck pain 09/22/2015   Hypercalcemia 09/22/2015   Hyperlipidemia 09/22/2015   Insulin  long-term use (HCC) 09/22/2015   Vitamin D deficiency 09/22/2015   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 06/26/2015   Allergic rhinitis 12/27/2013   Constipation 12/27/2013   Edema 12/27/2013   GERD (gastroesophageal reflux disease) 12/27/2013   Hyperlipemia 12/27/2013   Type 2 diabetes mellitus without complication, with long-term current use of insulin  (HCC) 12/27/2013   DIABETES MELLITUS, TYPE II 07/14/2010   CORONARY ATHEROSCLEROSIS NATIVE CORONARY ARTERY 05/14/2010   REDNESS OR DISCHARGE OF EYE 08/02/2007   CONTACT DERMATITIS 08/02/2007   OBESITY 06/14/2007   Essential hypertension 06/14/2007   GASTROESOPHAGEAL REFLUX DISEASE, HX OF 06/14/2007   Past Surgical History:  Procedure Laterality Date   BREAST BIOPSY Right 08/05/2010   LEFT HEART CATH AND CORONARY ANGIOGRAPHY N/A 05/07/2021   Procedure: LEFT HEART CATH AND CORONARY ANGIOGRAPHY;  Surgeon: Darron Deatrice LABOR, MD;  Location: [facility] INVASIVE CV LAB;  Service: Cardiovascular;  Laterality: N/A;   Current Outpatient Medications on File Prior to Visit  Medication Sig Dispense Refill   acetaminophen  (TYLENOL ) 500 MG tablet Take 1,000 mg by mouth every 6 (six) hours as needed for moderate pain.     aspirin  EC 81 MG tablet Take 1 tablet by mouth daily.     atorvastatin  (LIPITOR ) 80 MG tablet Take 1 tablet (80 mg total) by mouth daily. 90 tablet  2   BD PEN NEEDLE NANO U/F 32G X 4 MM MISC U UTD WITH VICTOZA   5   Carboxymethylcellulose Sodium (ARTIFICIAL TEARS OP) Place 1 drop into both eyes in the morning, at noon, in the evening, and at bedtime.     carvedilol  (COREG ) 12.5 MG tablet Take 1 tablet (12.5 mg total) by mouth 2 (two) times daily with a meal. 60 tablet 6   Continuous Blood Gluc Receiver (FREESTYLE LIBRE READER) DEVI Scan as needed up  to 3 times per day as directed. Dx E11.65     Continuous Blood Gluc Sensor (FREESTYLE LIBRE 14 DAY SENSOR) MISC FreeStyle Libre 14 Day Sensor kit     Continuous Blood Gluc Sensor (FREESTYLE LIBRE SENSOR SYSTEM) MISC Inject into the skin.     dorzolamide-timolol (COSOPT) 22.3-6.8 MG/ML ophthalmic solution Place 1 drop into the right eye 2 (two) times daily.     ezetimibe  (ZETIA ) 10 MG tablet Take 1 tablet (10 mg total) by mouth daily. 90 tablet 3   furosemide  (LASIX ) 20 MG tablet Take 0.5 tablet by mouth once daily     glucose blood test strip Accu-Chek SmartView Test Strips  TEST THREE TIMES DAILY AS DIRECTED.     insulin  regular human CONCENTRATED (HUMULIN R  U-500 KWIKPEN) 500 UNIT/ML KwikPen Inject 50-100 Units into the skin 2 (two) times daily with a meal. 100 units in AM and 50 units in PM     Insulin  Syringe-Needle U-100 (INSULIN  SYRINGE .5CC/31GX5/16) 31G X 5/16 0.5 ML MISC      Insulin  Syringe-Needle U-100 (INSULIN  SYRINGE 1CC/31GX5/16) 31G X 5/16 1 ML MISC      linaclotide  (LINZESS ) 145 MCG CAPS capsule Take 145 mcg by mouth daily.     lisinopril  (ZESTRIL ) 40 MG tablet Take 1 tablet (40 mg total) by mouth daily. 30 tablet 6   omeprazole (PRILOSEC) 40 MG capsule Take 40 mg by mouth daily.     OZEMPIC, 1 MG/DOSE, 4 MG/3ML SOPN      spironolactone  (ALDACTONE ) 25 MG tablet Take 1 tablet (25 mg total) by mouth daily. (Patient taking differently: Take 12.5 mg by mouth daily.) 30 tablet 6   torsemide  (DEMADEX ) 20 MG tablet Take 1 tablet (20 mg total) by mouth daily. 30 tablet 6   Vitamin D, Ergocalciferol, (DRISDOL) 1.25 MG (50000 UNIT) CAPS capsule Take 50,000 Units by mouth every Wednesday.     No current facility-administered medications on file prior to visit.    Allergies  Allergen Reactions   Iodinated Contrast Media Itching and Swelling    Burning, swelling burns   Social History   Occupational History   Occupation: disability  Tobacco Use   Smoking status: Never    Smokeless tobacco: Former    Types: Snuff    Quit date: 12/2020   Tobacco comments:    Snuff x 30 years  Vaping Use   Vaping status: Never Used  Substance and Sexual Activity   Alcohol use: Not Currently   Drug use: Never   Sexual activity: Not on file   Family History  Problem Relation Age of Onset   Heart attack Father    Hypertension Sister    Diabetes Sister    Hypertension Brother    Diabetes Brother    Hypertension Brother    Diabetes Brother    Hypertension Brother    Diabetes Brother    Immunization History  Administered Date(s) Administered   Fluad Quad(high Dose 65+) 05/08/2021   Influenza Whole 07/14/2010   PFIZER(Purple Top)SARS-COV-2  Vaccination 05/05/2020   Pfizer Covid-19 Vaccine Bivalent Booster 11yrs & up 05/08/2021     Review of Systems: Negative except as noted in the HPI.   Objective: There were no vitals filed for this visit.  Charlotte Stuart is a pleasant 73 y.o. female in NAD. AAO X 3.  Diabetic foot exam was performed with the following findings:   Vascular Examination: CFT <3 seconds b/l. DP/PT pulses faintly palpable b/l. Skin temperature gradient warm to warm b/l. No pain with calf compression. No ischemia or gangrene. No cyanosis or clubbing noted b/l. Nonpitting edema noted BLE.   Neurological Examination: Sensation grossly intact b/l with 10 gram monofilament. Vibratory sensation intact b/l.   Dermatological Examination: Pedal skin warm and supple b/l. Diffuse scaling noted peripherally and plantarly b/l feet.  No interdigital macerations.  No blisters, no weeping. No signs of secondary bacterial infection noted.  No open wounds. No interdigital macerations.  Toenails 1-5 b/l thick, discolored, elongated with subungual debris and pain on dorsal palpation.    No corns, calluses nor porokeratotic lesions noted.  Musculoskeletal Examination: Muscle strength 5/5 to all lower extremity muscle groups bilaterally. No pain, crepitus or  joint limitation noted with ROM bilateral LE. Pes planus deformity noted bilateral LE.  Radiographs: None     Lab Results  Component Value Date   HGBA1C 8.6 (H) 05/07/2021   ADA Risk Categorization: Low Risk :  Patient has all of the following: Intact protective sensation No prior foot ulcer  No severe deformity Pedal pulses present  Assessment: 1. Pain due to onychomycosis of toenail   2. Tinea pedis of both feet   3. Pes planus of both feet   4. Type II diabetes mellitus with peripheral circulatory disorder (HCC)   5. Encounter for diabetic foot exam (HCC)    Plan: Meds ordered this encounter  Medications   ketoconazole  (NIZORAL ) 2 % cream    Sig: As needed, apply to scaling skin of both feet and between toes once daily for 6 weeks.    Dispense:  60 g    Refill:  1  Diabetic foot examination performed today.  All patient's and/or POA's questions/concerns addressed on today's visit. Mycotic toenails 1-5 debrided in length and girth without incident. Continue daily foot inspections and monitor blood glucose per PCP/Endocrinologist's recommendations. Continue soft, supportive shoe gear daily. Report any pedal injuries to medical professional.  Discussed tinea pedis infection. To prevent re-infection of tinea pedis, patient/POA/caregiver instructed to spray shoes with Lysol every evening and clean tub/shower with bleach based cleanser. Rx sent to pharmacy for Ketoconazole  Cream 2% to be applied to both feet and between toes once daily for six week.  Patient/POA to call should there be question/concern in the interim. Return in about 3 months (around 05/25/2024).  Delon LITTIE Merlin, DPM      [place] LOCATION: [address] [place] [address]                   Office [phone]   [facility] LOCATION: [address] [place], [place] [address] Office [phone]

## 2024-05-30 ENCOUNTER — Ambulatory Visit: Admitting: Podiatry

## 2024-05-30 ENCOUNTER — Encounter: Payer: Self-pay | Admitting: Podiatry

## 2024-05-30 DIAGNOSIS — M79676 Pain in unspecified toe(s): Secondary | ICD-10-CM | POA: Diagnosis not present

## 2024-05-30 DIAGNOSIS — E1151 Type 2 diabetes mellitus with diabetic peripheral angiopathy without gangrene: Secondary | ICD-10-CM | POA: Diagnosis not present

## 2024-05-30 DIAGNOSIS — B351 Tinea unguium: Secondary | ICD-10-CM | POA: Diagnosis not present

## 2024-06-04 ENCOUNTER — Encounter: Payer: Self-pay | Admitting: Podiatry

## 2024-06-04 NOTE — Progress Notes (Signed)
  Subjective:  Patient ID: Charlotte Stuart, female    DOB: 1951-03-30,  MRN: [id number]  Charlotte Stuart presents to clinic today for at risk foot care. Pt has h/o NIDDM with PAD and painful thick toenails that are difficult to trim. Pain interferes with ambulation. Aggravating factors include wearing enclosed shoe gear. Pain is relieved with periodic professional debridement.  Chief Complaint  Patient presents with   Diabetes    Patient stated that she last seen PCP 3 weeks ago, PCP name is Truman Romney and her A1c was a 8.   New problem(s): None.   PCP is Romney Truman GRADE., MD.  Allergies  Allergen Reactions   Iodinated Contrast Media Itching and Swelling    Burning, swelling burns    Review of Systems: Negative except as noted in the HPI.  Objective: No changes noted in today's physical examination. There were no vitals filed for this visit. Charlotte Stuart is a pleasant 73 y.o. female in NAD. AAO x 3.  Vascular Examination: CFT <3 seconds b/l. DP/PT pulses faintly palpable b/l. Pedal edema trace b/l. Skin temperature gradient warm to warm b/l. Digital hair absent. No pain with calf compression. No ischemia or gangrene. No cyanosis or clubbing noted b/l.    Neurological Examination: Sensation grossly intact b/l with 10 gram monofilament. Vibratory sensation intact b/l.   Dermatological Examination: Pedal skin warm and supple b/l.   No open wounds. No interdigital macerations.  Toenails 1-5 b/l thick, discolored, elongated with subungual debris and pain on dorsal palpation.    No hyperkeratotic nor porokeratotic lesions.  Musculoskeletal Examination: Muscle strength 5/5 to all lower extremity muscle groups bilaterally. No pain, crepitus or joint limitation noted with ROM bilateral LE. Pes planus deformity noted bilateral LE.  Radiographs: None  Assessment/Plan: 1. Pain due to onychomycosis of toenail   2. Type II diabetes mellitus with peripheral  circulatory disorder [facility])   Consent given for treatment. Patient examined. All patient's and/or POA's questions/concerns addressed on today's visit. Toenails 1-5 debrided in length and girth without incident. Continue foot and shoe inspections daily. Monitor blood glucose per PCP/Endocrinologist's recommendations. Continue soft, supportive shoe gear daily. Report any pedal injuries to medical professional. Call office if there are any questions/concerns. -Patient/POA to call should there be question/concern in the interim.   Return in about 3 months (around 08/30/2024).  Delon LITTIE Merlin, DPM      [place] LOCATION: [address] [place] [address]                   Office [phone]   [facility] LOCATION: [address] [place], [place] [address] Office [phone]

## 2024-06-19 ENCOUNTER — Other Ambulatory Visit: Payer: Self-pay | Admitting: Cardiology

## 2024-06-19 DIAGNOSIS — E782 Mixed hyperlipidemia: Secondary | ICD-10-CM

## 2024-06-19 DIAGNOSIS — Z79899 Other long term (current) drug therapy: Secondary | ICD-10-CM

## 2024-06-19 DIAGNOSIS — I2510 Atherosclerotic heart disease of native coronary artery without angina pectoris: Secondary | ICD-10-CM

## 2024-07-18 ENCOUNTER — Encounter: Payer: Self-pay | Admitting: Cardiology

## 2024-09-13 ENCOUNTER — Ambulatory Visit: Admitting: Podiatry

## 2024-09-13 ENCOUNTER — Encounter: Payer: Self-pay | Admitting: Podiatry

## 2024-09-13 DIAGNOSIS — B351 Tinea unguium: Secondary | ICD-10-CM

## 2024-09-13 DIAGNOSIS — M79676 Pain in unspecified toe(s): Secondary | ICD-10-CM

## 2024-09-13 DIAGNOSIS — E1151 Type 2 diabetes mellitus with diabetic peripheral angiopathy without gangrene: Secondary | ICD-10-CM | POA: Diagnosis not present

## 2024-09-18 NOTE — Progress Notes (Signed)
"  °  Subjective:  Patient ID: Charlotte Stuart, female    DOB: 05-Feb-1951,  MRN: [id number]  Charlotte Stuart presents to clinic today for at risk foot care. Pt has h/o NIDDM with PAD and painful mycotic toenails of both feet that are difficult to trim. Pain interferes with daily activities and wearing enclosed shoe gear comfortably.  Chief Complaint  Patient presents with   [facility]    IDDM Patient with an A1c of 10.0 presents today for [facility] and nail trim denies numbness or tingling  ENDO: Odella Jacobson Last seen 3 mo ago    New problem(s): None.   PCP is Andrew Truman GRADE., MD.  Allergies[1]  Review of Systems: Negative except as noted in the HPI.  Objective: No changes noted in today's physical examination. There were no vitals filed for this visit. Charlotte Stuart is a pleasant 74 y.o. female in NAD. AAO x 3.  Vascular Examination: CFT <3 seconds b/l. DP/PT pulses faintly palpable b/l. Pedal edema trace b/l. Skin temperature gradient warm to warm b/l. Digital hair absent. No pain with calf compression. No ischemia or gangrene. No cyanosis or clubbing noted b/l.    Neurological Examination: Sensation grossly intact b/l with 10 gram monofilament. Vibratory sensation intact b/l.   Dermatological Examination: Pedal skin warm and supple b/l.   No open wounds. No interdigital macerations.  Toenails 1-5 b/l thick, discolored, elongated with subungual debris and pain on dorsal palpation.    No hyperkeratotic nor porokeratotic lesions.  Musculoskeletal Examination: Muscle strength 5/5 to all lower extremity muscle groups bilaterally. No pain, crepitus or joint limitation noted with ROM bilateral LE. Pes planus deformity noted bilateral LE.  Radiographs: None  Assessment/Plan: 1. Pain due to onychomycosis of toenail   2. Type II diabetes mellitus with peripheral circulatory disorder [facility])   Patient was evaluated and treated. All patient's and/or POA's questions/concerns addressed  on today's visit. Mycotic toenails 1-5 b/l debrided in length and girth without incident.  Continue daily foot inspections and monitor blood glucose per PCP/Endocrinologist's recommendations.Continue soft, supportive shoe gear daily. Report any pedal injuries to medical professional. Call office if there are any quesitons/concerns. -Patient/POA to call should there be question/concern in the interim.   Return in about 3 months (around 12/12/2024).  Delon LITTIE Merlin, DPM      [place] LOCATION: [address] [place] [address]                   Office [phone]   [place] LOCATION: [address] [place], [place] [address] Office [phone]     [1]  Allergies Allergen Reactions   Iodinated Contrast Media Itching and Swelling    Burning, swelling burns   "

## 2024-12-13 ENCOUNTER — Ambulatory Visit: Admitting: Podiatry
